# Patient Record
Sex: Female | Born: 1940 | Race: White | Hispanic: No | Marital: Married | Smoking: Never smoker
Health system: Southern US, Community
[De-identification: ages and names within clinical notes are randomized; demographics above are authoritative.]

## PROBLEM LIST (undated history)

## (undated) DIAGNOSIS — E860 Dehydration: Secondary | ICD-10-CM

## (undated) DIAGNOSIS — C50919 Malignant neoplasm of unspecified site of unspecified female breast: Secondary | ICD-10-CM

## (undated) DIAGNOSIS — C539 Malignant neoplasm of cervix uteri, unspecified: Secondary | ICD-10-CM

## (undated) DIAGNOSIS — K219 Gastro-esophageal reflux disease without esophagitis: Secondary | ICD-10-CM

## (undated) DIAGNOSIS — Z7189 Other specified counseling: Secondary | ICD-10-CM

## (undated) DIAGNOSIS — F0280 Dementia in other diseases classified elsewhere without behavioral disturbance: Secondary | ICD-10-CM

## (undated) DIAGNOSIS — F0390 Unspecified dementia without behavioral disturbance: Secondary | ICD-10-CM

## (undated) DIAGNOSIS — R87619 Unspecified abnormal cytological findings in specimens from cervix uteri: Secondary | ICD-10-CM

## (undated) DIAGNOSIS — N1330 Unspecified hydronephrosis: Secondary | ICD-10-CM

## (undated) DIAGNOSIS — G309 Alzheimer's disease, unspecified: Secondary | ICD-10-CM

## (undated) DIAGNOSIS — R1900 Intra-abdominal and pelvic swelling, mass and lump, unspecified site: Secondary | ICD-10-CM

## (undated) HISTORY — DX: Unspecified abnormal cytological findings in specimens from cervix uteri: R87.619

## (undated) HISTORY — DX: Unspecified dementia, unspecified severity, without behavioral disturbance, psychotic disturbance, mood disturbance, and anxiety: F03.90

## (undated) HISTORY — DX: Dementia in other diseases classified elsewhere, unspecified severity, without behavioral disturbance, psychotic disturbance, mood disturbance, and anxiety: F02.80

## (undated) HISTORY — DX: Other specified counseling: Z71.89

## (undated) HISTORY — DX: Malignant neoplasm of unspecified site of unspecified female breast: C50.919

## (undated) HISTORY — DX: Malignant neoplasm of cervix uteri, unspecified: C53.9

## (undated) HISTORY — DX: Dehydration: E86.0

## (undated) HISTORY — PX: TOTAL HIP ARTHROPLASTY: SHX124

## (undated) HISTORY — PX: APPENDECTOMY: SHX54

## (undated) HISTORY — DX: Gastro-esophageal reflux disease without esophagitis: K21.9

## (undated) HISTORY — PX: BREAST LUMPECTOMY: SHX2

## (undated) HISTORY — DX: Alzheimer's disease, unspecified: G30.9

---

## 2007-09-27 ENCOUNTER — Ambulatory Visit: Payer: Self-pay | Admitting: Internal Medicine

## 2008-10-26 ENCOUNTER — Ambulatory Visit: Payer: Self-pay | Admitting: Internal Medicine

## 2008-11-13 ENCOUNTER — Ambulatory Visit: Payer: Self-pay | Admitting: General Practice

## 2008-11-28 ENCOUNTER — Inpatient Hospital Stay: Payer: Self-pay | Admitting: General Practice

## 2009-10-12 ENCOUNTER — Ambulatory Visit: Payer: Self-pay | Admitting: Internal Medicine

## 2009-10-29 ENCOUNTER — Ambulatory Visit: Payer: Self-pay | Admitting: Internal Medicine

## 2011-09-04 ENCOUNTER — Ambulatory Visit: Payer: Self-pay | Admitting: Internal Medicine

## 2012-05-12 ENCOUNTER — Ambulatory Visit: Payer: Self-pay | Admitting: Internal Medicine

## 2014-12-08 ENCOUNTER — Ambulatory Visit: Payer: Self-pay | Admitting: Internal Medicine

## 2014-12-12 ENCOUNTER — Ambulatory Visit: Payer: Self-pay | Admitting: Internal Medicine

## 2016-01-14 ENCOUNTER — Other Ambulatory Visit: Payer: Self-pay | Admitting: Internal Medicine

## 2016-01-14 DIAGNOSIS — R1310 Dysphagia, unspecified: Secondary | ICD-10-CM

## 2016-01-14 DIAGNOSIS — Z1231 Encounter for screening mammogram for malignant neoplasm of breast: Secondary | ICD-10-CM

## 2016-01-16 ENCOUNTER — Ambulatory Visit: Payer: Self-pay

## 2016-01-23 ENCOUNTER — Ambulatory Visit: Payer: Self-pay

## 2016-01-28 ENCOUNTER — Ambulatory Visit: Payer: Self-pay

## 2017-08-07 ENCOUNTER — Other Ambulatory Visit: Payer: Self-pay | Admitting: Internal Medicine

## 2017-08-07 ENCOUNTER — Ambulatory Visit
Admission: RE | Admit: 2017-08-07 | Discharge: 2017-08-07 | Disposition: A | Discharge status: Home / Self Care | Payer: Medicare Other | Source: Ambulatory Visit | Attending: Internal Medicine | Admitting: Internal Medicine

## 2017-08-07 DIAGNOSIS — N1330 Unspecified hydronephrosis: Secondary | ICD-10-CM | POA: Diagnosis not present

## 2017-08-07 DIAGNOSIS — R918 Other nonspecific abnormal finding of lung field: Secondary | ICD-10-CM | POA: Diagnosis not present

## 2017-08-07 DIAGNOSIS — R590 Localized enlarged lymph nodes: Secondary | ICD-10-CM | POA: Diagnosis not present

## 2017-08-10 ENCOUNTER — Other Ambulatory Visit: Payer: Self-pay | Admitting: Radiology

## 2017-08-10 ENCOUNTER — Encounter: Payer: Self-pay | Admitting: Urology

## 2017-08-10 ENCOUNTER — Ambulatory Visit (INDEPENDENT_AMBULATORY_CARE_PROVIDER_SITE_OTHER): Payer: Medicare Other | Admitting: Urology

## 2017-08-10 VITALS — BP 140/82 | HR 83 | Ht 65.0 in | Wt 138.2 lb

## 2017-08-10 DIAGNOSIS — N1330 Unspecified hydronephrosis: Secondary | ICD-10-CM

## 2017-08-10 LAB — MICROSCOPIC EXAMINATION
BACTERIA UA: NONE SEEN
Epithelial Cells (non renal): NONE SEEN /hpf (ref 0–10)

## 2017-08-10 LAB — URINALYSIS, COMPLETE
Bilirubin, UA: NEGATIVE
GLUCOSE, UA: NEGATIVE
Ketones, UA: NEGATIVE
NITRITE UA: NEGATIVE
Protein, UA: NEGATIVE
Specific Gravity, UA: 1.01 (ref 1.005–1.030)
UUROB: 0.2 mg/dL (ref 0.2–1.0)
pH, UA: 5 (ref 5.0–7.5)

## 2017-08-10 NOTE — H&P (View-Only) (Signed)
08/10/2017 2:26 PM   Jenna Parrish 02-04-1941 [id number]  Referring provider: Adin Hector, MD [facility] [place],  [address]  Chief Complaint  Patient presents with  . Hydronephrosis    HPI: Consulted for hydronephrosis  The patient was assessed today for hydronephrosis.  Her husband answered most of the questions.  Apparently she does have some dementia.  By history she voids every 1 or 2 hours sometimes a small amount.  She might get up once at night.  On Saturday without other symptoms she noted she had blood in the urine.  She presented in the last month with dizziness diffuse back pain and not eating well.  She was sent over I believe by her primary care physician Dr. Caryl Comes  The patient's back pain is diffuse on both sides from the costal margin down to the lower back.  I reviewed her CT scan and she has impressive bilateral hydronephrosis as noted below.  Her serum creatinine May 15 was 1.0 and on November 7 it was 2.1  Today she did not have microscopic hematuria  Modifying factors: There are no other modifying factors  Associated signs and symptoms: There are no other associated signs and symptoms Aggravating and relieving factors: There are no other aggravating or relieving factors Severity: Moderate Duration: Persistent  Nov 9th CTscan Adrenals/Urinary Tract: The adrenal glands are unremarkable. Bilateral hydronephrosis and hydroureter identified right greater than left. Dilated right ureter extends into the pelvis where it abruptly terminates near the expanded vaginal dome/cervix, image number 58 of series 2. Similarly the dilated left ureter terminates in the region of the expanded left side of vaginal dome/cervix. This area is poorly visualized due to lack of IV contrast material and beam hardening artifact from right hip arthroplasty device. Urinary bladder is grossly unremarkable.  IMPRESSION: 1. Bilateral  hydronephrosis identified right greater than left. Dilated ureters extend to the level of the pelvis. No obstructing stone identified. Suspicious for distal ureteral obstruction secondary to pelvic mass. 2. Enlargement of the vaginal dome and cervical area. Suspicious for underlying malignancy. Correlation with physical exam findings, pelvic ultrasound and/or contrast enhanced CT of the pelvis is advised. 3. Enlarged retroperitoneal lymph nodes. Suspicious for metastatic adenopathy. 4. There are 2 nonspecific pulmonary nodules identified within the right lower lobe which measure up to 1 cm. Non-contrast chest CT at 3-6 months is recommended. If the nodules are stable at time of repeat CT, then future CT at 18-24 months (from today's scan) is considered optional for low-risk patients, but is recommended for high-risk patients.    PMH: Past Medical History:  Diagnosis Date  . Breast cancer ([place])   . Dementia   . GERD (gastroesophageal reflux disease)     Surgical History: Past Surgical History:  Procedure Laterality Date  . APPENDECTOMY    . BREAST LUMPECTOMY      Home Medications:  Allergies as of 08/10/2017      Reactions   Salicylates Hives   Other reaction(s): Unknown   Codeine    Other reaction(s): Unknown   Donepezil    Other reaction(s): Other (See Comments) sedation   Galantamine    Other reaction(s): Dizziness      Medication List        Accurate as of 08/10/17  2:26 PM. Always use your most recent med list.          alendronate 70 MG tablet Commonly known as:  FOSAMAX TAKE 1 TABLET BY MOUTH  EVERY 7 DAYS. TAKE WITH A FULL GLASS OF WATER. DO NOT LIE DOWN FOR THE NEXT 30 MINUTES.   D 1000 1000 units capsule Generic drug:  Cholecalciferol Take by mouth.   memantine 10 MG tablet Commonly known as:  NAMENDA Take by mouth.   vitamin B-12 1000 MCG tablet Commonly known as:  CYANOCOBALAMIN Take 1,000 mcg daily by mouth.       Allergies:    Allergies  Allergen Reactions  . Salicylates Hives    Other reaction(s): Unknown  . Codeine     Other reaction(s): Unknown  . Donepezil     Other reaction(s): Other (See Comments) sedation  . Galantamine     Other reaction(s): Dizziness    Family History: Family History  Problem Relation Age of Onset  . Bladder Cancer Neg Hx   . Kidney cancer Neg Hx     Social History:  reports that  has never smoked. she has never used smokeless tobacco. She reports that she does not drink alcohol or use drugs.  ROS: UROLOGY Frequent Urination?: Yes Hard to postpone urination?: No Burning/pain with urination?: No Get up at night to urinate?: No Leakage of urine?: No Urine stream starts and stops?: No Trouble starting stream?: No Do you have to strain to urinate?: No Blood in urine?: Yes Urinary tract infection?: No Sexually transmitted disease?: No Injury to kidneys or bladder?: No Painful intercourse?: No Weak stream?: No Currently pregnant?: No Vaginal bleeding?: No Last menstrual period?: n  Gastrointestinal Nausea?: Yes Vomiting?: No Indigestion/heartburn?: No Diarrhea?: No Constipation?: No  Constitutional Fever: No Night sweats?: No Weight loss?: No Fatigue?: No  Skin Skin rash/lesions?: No Itching?: No  Eyes Blurred vision?: No Double vision?: No  Ears/Nose/Throat Sore throat?: No Sinus problems?: No  Hematologic/Lymphatic Swollen glands?: No Easy bruising?: No  Cardiovascular Leg swelling?: No Chest pain?: No  Respiratory Cough?: No Shortness of breath?: No  Endocrine Excessive thirst?: No  Musculoskeletal Back pain?: Yes Joint pain?: No  Neurological Headaches?: Yes Dizziness?: Yes  Psychologic Depression?: No Anxiety?: No  Physical Exam: BP 140/82 (BP Location: Right Arm, Patient Position: Sitting, Cuff Size: Normal)   Pulse 83   Ht 5\' 5"  (1.651 m)   Wt 138 lb 3.2 oz (62.7 kg)   BMI 23.00 kg/m   Constitutional:  Alert  and oriented, No acute distress. HEENT: [place] AT, moist mucus membranes.  Trachea midline, no masses. Cardiovascular: No clubbing, cyanosis, or edema. Respiratory: Normal respiratory effort, no increased work of breathing. GI: Abdomen is soft, nontender, nondistended, no abdominal masses GU: On pelvic examination the bladder was well supported.  I tried to use a double speculum to visualize the cervix.  I thought it felt a bit nodular but I could not say for certain.  Because the discomfort I was not able to do a thorough examination and visualize a cervix with a double speculum. Skin: No rashes, bruises or suspicious lesions. Lymph: No cervical or inguinal adenopathy. Neurologic: Grossly intact, no focal deficits, moving all 4 extremities. Psychiatric: Normal mood and affect.  Laboratory Data:  Urinalysis No results found for: COLORURINE, APPEARANCEUR, LABSPEC, PHURINE, GLUCOSEU, HGBUR, BILIRUBINUR, KETONESUR, PROTEINUR, UROBILINOGEN, NITRITE, LEUKOCYTESUR  Pertinent Imaging: As noted  Assessment & Plan: A picture was drawn.  The patient has bilateral hydronephrosis from a pelvic mass.  She has not had a hysterectomy.  I had difficulty finding clinical notes today.  The bleeding perhaps was uterine but she will need a cystoscopy.  The role of cystoscopy and  bilateral stents with pros cons and risks discussed.  Assuming the cystoscopy to be normal the patient would benefit from a gynecological workup.  A consultation with gynecology had already been suggested.  I think the stents should be put in fairly quickly but they are not an emergency.  They will be placed by 1 of my partners.  It is my understanding that the patient will be seen her primary care physician immediately after this visit.  I will be happy to speak with him and my cell number is [phone] if needed   1. Hydronephrosis, unspecified hydronephrosis type 2.  Renal insufficiency - Urinalysis, Complete   No Follow-up on  file.  Reece Packer, MD  [facility] [address], [place],  [address] [phone]

## 2017-08-10 NOTE — Progress Notes (Signed)
08/10/2017 2:26 PM   Jenna Parrish 11-27-1940 [id number]  Referring provider: Adin Hector, MD [facility] [place], [place] [address]  Chief Complaint  Patient presents with  . Hydronephrosis    HPI: Consulted for hydronephrosis  The patient was assessed today for hydronephrosis.  Her husband answered most of the questions.  Apparently she does have some dementia.  By history she voids every 1 or 2 hours sometimes a small amount.  She might get up once at night.  On Saturday without other symptoms she noted she had blood in the urine.  She presented in the last month with dizziness diffuse back pain and not eating well.  She was sent over I believe by her primary care physician Dr. Caryl Comes  The patient's back pain is diffuse on both sides from the costal margin down to the lower back.  I reviewed her CT scan and she has impressive bilateral hydronephrosis as noted below.  Her serum creatinine May 15 was 1.0 and on November 7 it was 2.1  Today she did not have microscopic hematuria  Modifying factors: There are no other modifying factors  Associated signs and symptoms: There are no other associated signs and symptoms Aggravating and relieving factors: There are no other aggravating or relieving factors Severity: Moderate Duration: Persistent  Nov 9th CTscan Adrenals/Urinary Tract: The adrenal glands are unremarkable. Bilateral hydronephrosis and hydroureter identified right greater than left. Dilated right ureter extends into the pelvis where it abruptly terminates near the expanded vaginal dome/cervix, image number 58 of series 2. Similarly the dilated left ureter terminates in the region of the expanded left side of vaginal dome/cervix. This area is poorly visualized due to lack of IV contrast material and beam hardening artifact from right hip arthroplasty device. Urinary bladder is grossly unremarkable.  IMPRESSION: 1. Bilateral  hydronephrosis identified right greater than left. Dilated ureters extend to the level of the pelvis. No obstructing stone identified. Suspicious for distal ureteral obstruction secondary to pelvic mass. 2. Enlargement of the vaginal dome and cervical area. Suspicious for underlying malignancy. Correlation with physical exam findings, pelvic ultrasound and/or contrast enhanced CT of the pelvis is advised. 3. Enlarged retroperitoneal lymph nodes. Suspicious for metastatic adenopathy. 4. There are 2 nonspecific pulmonary nodules identified within the right lower lobe which measure up to 1 cm. Non-contrast chest CT at 3-6 months is recommended. If the nodules are stable at time of repeat CT, then future CT at 18-24 months (from today's scan) is considered optional for low-risk patients, but is recommended for high-risk patients.    PMH: Past Medical History:  Diagnosis Date  . Breast cancer ([place])   . Dementia   . GERD (gastroesophageal reflux disease)     Surgical History: Past Surgical History:  Procedure Laterality Date  . APPENDECTOMY    . BREAST LUMPECTOMY      Home Medications:  Allergies as of 08/10/2017      Reactions   Salicylates Hives   Other reaction(s): Unknown   Codeine    Other reaction(s): Unknown   Donepezil    Other reaction(s): Other (See Comments) sedation   Galantamine    Other reaction(s): Dizziness      Medication List        Accurate as of 08/10/17  2:26 PM. Always use your most recent med list.          alendronate 70 MG tablet Commonly known as:  FOSAMAX TAKE 1 TABLET BY MOUTH  EVERY 7 DAYS. TAKE WITH A FULL GLASS OF WATER. DO NOT LIE DOWN FOR THE NEXT 30 MINUTES.   D 1000 1000 units capsule Generic drug:  Cholecalciferol Take by mouth.   memantine 10 MG tablet Commonly known as:  NAMENDA Take by mouth.   vitamin B-12 1000 MCG tablet Commonly known as:  CYANOCOBALAMIN Take 1,000 mcg daily by mouth.       Allergies:    Allergies  Allergen Reactions  . Salicylates Hives    Other reaction(s): Unknown  . Codeine     Other reaction(s): Unknown  . Donepezil     Other reaction(s): Other (See Comments) sedation  . Galantamine     Other reaction(s): Dizziness    Family History: Family History  Problem Relation Age of Onset  . Bladder Cancer Neg Hx   . Kidney cancer Neg Hx     Social History:  reports that  has never smoked. she has never used smokeless tobacco. She reports that she does not drink alcohol or use drugs.  ROS: UROLOGY Frequent Urination?: Yes Hard to postpone urination?: No Burning/pain with urination?: No Get up at night to urinate?: No Leakage of urine?: No Urine stream starts and stops?: No Trouble starting stream?: No Do you have to strain to urinate?: No Blood in urine?: Yes Urinary tract infection?: No Sexually transmitted disease?: No Injury to kidneys or bladder?: No Painful intercourse?: No Weak stream?: No Currently pregnant?: No Vaginal bleeding?: No Last menstrual period?: n  Gastrointestinal Nausea?: Yes Vomiting?: No Indigestion/heartburn?: No Diarrhea?: No Constipation?: No  Constitutional Fever: No Night sweats?: No Weight loss?: No Fatigue?: No  Skin Skin rash/lesions?: No Itching?: No  Eyes Blurred vision?: No Double vision?: No  Ears/Nose/Throat Sore throat?: No Sinus problems?: No  Hematologic/Lymphatic Swollen glands?: No Easy bruising?: No  Cardiovascular Leg swelling?: No Chest pain?: No  Respiratory Cough?: No Shortness of breath?: No  Endocrine Excessive thirst?: No  Musculoskeletal Back pain?: Yes Joint pain?: No  Neurological Headaches?: Yes Dizziness?: Yes  Psychologic Depression?: No Anxiety?: No  Physical Exam: BP 140/82 (BP Location: Right Arm, Patient Position: Sitting, Cuff Size: Normal)   Pulse 83   Ht 5\' 5"  (1.651 m)   Wt 138 lb 3.2 oz (62.7 kg)   BMI 23.00 kg/m   Constitutional:  Alert  and oriented, No acute distress. HEENT: [place] AT, moist mucus membranes.  Trachea midline, no masses. Cardiovascular: No clubbing, cyanosis, or edema. Respiratory: Normal respiratory effort, no increased work of breathing. GI: Abdomen is soft, nontender, nondistended, no abdominal masses GU: On pelvic examination the bladder was well supported.  I tried to use a double speculum to visualize the cervix.  I thought it felt a bit nodular but I could not say for certain.  Because the discomfort I was not able to do a thorough examination and visualize a cervix with a double speculum. Skin: No rashes, bruises or suspicious lesions. Lymph: No cervical or inguinal adenopathy. Neurologic: Grossly intact, no focal deficits, moving all 4 extremities. Psychiatric: Normal mood and affect.  Laboratory Data:  Urinalysis No results found for: COLORURINE, APPEARANCEUR, LABSPEC, PHURINE, GLUCOSEU, HGBUR, BILIRUBINUR, KETONESUR, PROTEINUR, UROBILINOGEN, NITRITE, LEUKOCYTESUR  Pertinent Imaging: As noted  Assessment & Plan: A picture was drawn.  The patient has bilateral hydronephrosis from a pelvic mass.  She has not had a hysterectomy.  I had difficulty finding clinical notes today.  The bleeding perhaps was uterine but she will need a cystoscopy.  The role of cystoscopy and  bilateral stents with pros cons and risks discussed.  Assuming the cystoscopy to be normal the patient would benefit from a gynecological workup.  A consultation with gynecology had already been suggested.  I think the stents should be put in fairly quickly but they are not an emergency.  They will be placed by 1 of my partners.  It is my understanding that the patient will be seen her primary care physician immediately after this visit.  I will be happy to speak with him and my cell number is [phone] if needed   1. Hydronephrosis, unspecified hydronephrosis type 2.  Renal insufficiency - Urinalysis, Complete   No Follow-up on  file.  Reece Packer, MD  [facility] [address], [place], [place] [address] [phone]

## 2017-08-11 ENCOUNTER — Encounter
Admission: RE | Admit: 2017-08-11 | Discharge: 2017-08-11 | Disposition: A | Discharge status: Home / Self Care | Payer: Medicare Other | Source: Ambulatory Visit | Attending: Urology | Admitting: Urology

## 2017-08-11 ENCOUNTER — Other Ambulatory Visit: Payer: Self-pay

## 2017-08-11 DIAGNOSIS — R590 Localized enlarged lymph nodes: Secondary | ICD-10-CM | POA: Diagnosis present

## 2017-08-11 DIAGNOSIS — Z85828 Personal history of other malignant neoplasm of skin: Secondary | ICD-10-CM

## 2017-08-11 DIAGNOSIS — Q6210 Congenital occlusion of ureter, unspecified: Secondary | ICD-10-CM

## 2017-08-11 DIAGNOSIS — Z853 Personal history of malignant neoplasm of breast: Secondary | ICD-10-CM

## 2017-08-11 DIAGNOSIS — N131 Hydronephrosis with ureteral stricture, not elsewhere classified: Secondary | ICD-10-CM | POA: Diagnosis not present

## 2017-08-11 DIAGNOSIS — K219 Gastro-esophageal reflux disease without esophagitis: Secondary | ICD-10-CM | POA: Diagnosis present

## 2017-08-11 DIAGNOSIS — N179 Acute kidney failure, unspecified: Secondary | ICD-10-CM | POA: Diagnosis not present

## 2017-08-11 DIAGNOSIS — R1900 Intra-abdominal and pelvic swelling, mass and lump, unspecified site: Secondary | ICD-10-CM | POA: Diagnosis present

## 2017-08-11 DIAGNOSIS — Z79899 Other long term (current) drug therapy: Secondary | ICD-10-CM | POA: Diagnosis not present

## 2017-08-11 DIAGNOSIS — Z96641 Presence of right artificial hip joint: Secondary | ICD-10-CM | POA: Diagnosis not present

## 2017-08-11 DIAGNOSIS — Z888 Allergy status to other drugs, medicaments and biological substances status: Secondary | ICD-10-CM | POA: Diagnosis not present

## 2017-08-11 DIAGNOSIS — F0390 Unspecified dementia without behavioral disturbance: Secondary | ICD-10-CM | POA: Diagnosis present

## 2017-08-11 DIAGNOSIS — Z886 Allergy status to analgesic agent status: Secondary | ICD-10-CM

## 2017-08-11 DIAGNOSIS — M810 Age-related osteoporosis without current pathological fracture: Secondary | ICD-10-CM | POA: Diagnosis not present

## 2017-08-11 DIAGNOSIS — Z885 Allergy status to narcotic agent status: Secondary | ICD-10-CM | POA: Diagnosis not present

## 2017-08-11 DIAGNOSIS — R918 Other nonspecific abnormal finding of lung field: Secondary | ICD-10-CM | POA: Diagnosis present

## 2017-08-11 MED ORDER — CIPROFLOXACIN IN D5W 400 MG/200ML IV SOLN
400.0000 mg | INTRAVENOUS | Status: AC
  Administered 2017-08-12: 400 mg via INTRAVENOUS

## 2017-08-11 NOTE — Patient Instructions (Signed)
Your procedure is scheduled on: Tomorrow Report to DAY SURGERY. 2ND FLOOR MEDICAL MALL ENTRANCE. At 12:00 To find out your arrival time please call [phone] between 1PM - 3PM on .  Remember: Instructions that are not followed completely may result in serious medical risk, up to and including death, or upon the discretion of your surgeon and anesthesiologist your surgery may need to be rescheduled.    __X__ 1. Do not eat anything after midnight the night before your    procedure.  No gum chewing or hard candies.  You may drink clear   liquids up to 2 hours before you are scheduled to arrive at the   hospital for your procedure. Do not drink clear liquids within 2   hours of scheduled arrival to the hospital as this may lead to your   procedure being delayed or rescheduled.       Clear liquids include:   Water or Apple juice without pulp   Clear carbohydrate beverage such as Clearfast or Gatorade   Black coffee or Clear Tea (no milk, no creamer, do not add anything   to the coffee or tea)    Diabetics should only drink water   __X__ 2. No Alcohol for 24 hours before or after surgery.   ____ 3. Bring all medications with you on the day of surgery if instructed.    __X__ 4. Notify your doctor if there is any change in your medical condition     (cold, fever, infections).             __X___5. No smoking within 24 hours of your surgery.     Do not wear jewelry, make-up, hairpins, clips or nail polish.  Do not wear lotions, powders, or perfumes.   Do not shave 48 hours prior to surgery. Men may shave face and neck.  Do not bring valuables to the hospital.    [facility] is not responsible for any belongings or valuables.               Contacts, dentures or bridgework may not be worn into surgery.  Leave your suitcase in the car. After surgery it may be brought to your room.  For patients admitted to the hospital, discharge time is determined by your                treatment  team.   Patients discharged the day of surgery will not be allowed to drive home.   Please read over the following fact sheets that you were given:   MRSA Information   __X__ Take these medicines the morning of surgery with A SIP OF WATER:    1. NAMENDA  2.   3.   4.  5.  6.  ____ Fleet Enema (as directed)   __X _ Use CHG Soap/SAGE wipes as directed  ____ Use inhalers on the day of surgery  ____ Stop metformin 2 days prior to surgery    ____ Take 1/2 of usual insulin dose the night before surgery and none on the morning of surgery.   ____ Stop Coumadin/Plavix/aspirin on   __X__ Stop Anti-inflammatories such as Advil, Aleve, Ibuprofen, Motrin, Naproxen, Naprosyn, Goodies,powder, or aspirin products.  OK to take Tylenol.   __X__ Stop supplements, Vitamin E, Fish Oil until after surgery.    ____ Bring C-Pap to the hospital.

## 2017-08-11 NOTE — H&P (Signed)
Jenna Parrish is a 76 y.o. female here for Referred by Dr Caryl Comes for pelvic mass .  Consult from Dr Caryl Comes for rising creatinine and bilateral hydronephrosis/ hydroureter right> left.  Ctscan 08/07/17 shows  Dilated ureters terminates at " expanded vaginal dome " . + retroperitoneal adenopathy( aortacaval node 2.1 cm ) and periaortic 1.0 cm . 2 nodules noted in RLL .  U/s difficult to perform TVUS due to patient discomfort . Adb u/s shows the endometrial stripe  10.6 mm . Complex 3cm left ovarian cyst internal projection .   Pt has been dizzy according to husband for 30 days and has lost 5 pounds in 6 months. Appetite poor with early satiety . + vaginal bleeding last week.   patient has not been getting pelvic exams. She to her husband's account has not had a colonoscopy .   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    Past Medical History:  has a past medical history of Allergic state, Cancer (CMS-HCC), Diverticulosis, GERD (gastroesophageal reflux disease), History of shingles, Hyperlipidemia, unspecified, Leukopenia, Osteoarthritis, Osteoporosis, post-menopausal, and Peripheral neuropathy.  Past Surgical History:  has a past surgical history that includes Tubal ligation (Bilateral); Skin cancer removal (Left, 2007); Bladder tack (03/2001); Mastectomy partial / lumpectomy w/ axillary lymphadenectomy (03/2000); Arthroplasty Hip Total (Right, 11/28/08); and Appendectomy (1963). Family History: family history includes Coronary Artery Disease (Blocked arteries around heart) in her father; High blood pressure (Hypertension) in her father; Hyperlipidemia (Elevated cholesterol) in her father; Myocardial Infarction (Heart attack) in her father. Social History:  reports that she has never smoked. She has never used smokeless tobacco. She reports that she does not drink alcohol or use drugs. OB/GYN History:          OB History    Gravida  2   Para  2   Term      Preterm      AB      Living  2     SAB      TAB      Ectopic      Molar      Multiple      Live Births  2          Allergies: is allergic to aspirin; aricept [donepezil]; codeine phosphate (bulk); galantamine; and salicylates. Medications:  Current Outpatient Medications:  .  alendronate (FOSAMAX) 70 MG tablet, TAKE 1 TABLET BY MOUTH EVERY 7 DAYS. TAKE WITH A FULL GLASS OF WATER. DO NOT LIE DOWN FOR THE NEXT 30 MINUTES., Disp: 12 tablet, Rfl: 4 .  cholecalciferol (VITAMIN D3) 1,000 unit capsule, Take 1,000 Units by mouth once daily., Disp: , Rfl:  .  CYANOCOBALAMIN, VITAMIN B-12, (VITAMIN B12 ORAL), Take by mouth. One daily, Disp: , Rfl:  .  memantine (NAMENDA) 10 MG tablet, Take 1 tablet (10 mg total) by mouth 2 (two) times daily., Disp: 60 tablet, Rfl: 11  Review of Systems: General:                      No fatigue, +weight loss, + dizziness Eyes:                           No vision changes Ears:                            No hearing difficulty Respiratory:  No cough or shortness of breath Pulmonary:                  No asthma or shortness of breath Cardiovascular:           No chest pain, palpitations, dyspnea on exertion Gastrointestinal:          No abdominal bloating, chronic diarrhea, constipations, masses, pain or hematochezia, + nausea Genitourinary:             No hematuria, dysuria, abnormal vaginal discharge, pelvic pain, Menometrorrhagia, + PMB  Lymphatic:                   No swollen lymph nodes Musculoskeletal:         No muscle weakness Neurologic:                  No extremity weakness, syncope, seizure disorder Psychiatric:                  No history of depression, delusions or suicidal/homicidal ideation, + memory loss    Exam:      Vitals:   08/11/17 1029  BP: 132/89  Pulse: 79    Body mass index is 23.13 kg/m.  WDWN white/ female in NAD   Lungs: CTA  CV : RRR without murmur   Breast: exam done in sitting and lying  position : No dimpling or retraction, no dominant mass, no spontaneous discharge, no axillary adenopathy Neck:  no thyromegaly Abdomen: soft , no mass, normal active bowel sounds,  non-tender, no rebound tenderness Pelvic: tanner stage 5 ,  External genitalia: vulva /labia no lesions Urethra: no prolapse Vagina: vault is full and firm  Cervix: 5x5 cm mass at vault, cervix is ill defined . Friable with pap . Mass is fixed  Uterus: 8cm, , fixed  Adnexa: no mass,  non-tender   Rectovaginal: no mass heme pending   Impression:   The primary encounter diagnosis was Cervical mass. Diagnoses of Retroperitoneal lymphadenopathy, Hydroureter, Mass of left ovary, Thickened endometrium, Cervical cancer screening, and Encounter for screening fecal occult blood testing were also pertinent to this visit.  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  Plan:   Pt to get ureteral stenting tomorrow by urology in OR  I will perform cx bx and attempt to sample the lining of the endometrium  She is set up for consultation with Gyn Onc 08/19/17 at Cancer center      .  Caroline Sauger, MD

## 2017-08-12 ENCOUNTER — Encounter: Payer: Self-pay | Admitting: *Deleted

## 2017-08-12 ENCOUNTER — Ambulatory Visit: Payer: Medicare Other

## 2017-08-12 ENCOUNTER — Encounter
Admission: RE | Disposition: A | Discharge status: Home / Self Care | Payer: Self-pay | Source: Ambulatory Visit | Attending: Urology

## 2017-08-12 ENCOUNTER — Ambulatory Visit: Payer: Medicare Other | Admitting: Certified Registered Nurse Anesthetist

## 2017-08-12 ENCOUNTER — Telehealth: Payer: Self-pay | Admitting: Urology

## 2017-08-12 ENCOUNTER — Ambulatory Visit
Admission: RE | Admit: 2017-08-12 | Discharge: 2017-08-12 | Disposition: A | Discharge status: Home / Self Care | Payer: Medicare Other | Source: Ambulatory Visit | Attending: Urology | Admitting: Urology

## 2017-08-12 DIAGNOSIS — Z853 Personal history of malignant neoplasm of breast: Secondary | ICD-10-CM | POA: Insufficient documentation

## 2017-08-12 DIAGNOSIS — F0390 Unspecified dementia without behavioral disturbance: Secondary | ICD-10-CM

## 2017-08-12 DIAGNOSIS — M810 Age-related osteoporosis without current pathological fracture: Secondary | ICD-10-CM | POA: Insufficient documentation

## 2017-08-12 DIAGNOSIS — Z79899 Other long term (current) drug therapy: Secondary | ICD-10-CM

## 2017-08-12 DIAGNOSIS — Q6210 Congenital occlusion of ureter, unspecified: Secondary | ICD-10-CM

## 2017-08-12 DIAGNOSIS — R918 Other nonspecific abnormal finding of lung field: Secondary | ICD-10-CM

## 2017-08-12 DIAGNOSIS — N1330 Unspecified hydronephrosis: Secondary | ICD-10-CM | POA: Diagnosis not present

## 2017-08-12 DIAGNOSIS — Z96641 Presence of right artificial hip joint: Secondary | ICD-10-CM

## 2017-08-12 DIAGNOSIS — K219 Gastro-esophageal reflux disease without esophagitis: Secondary | ICD-10-CM

## 2017-08-12 DIAGNOSIS — Z85828 Personal history of other malignant neoplasm of skin: Secondary | ICD-10-CM | POA: Insufficient documentation

## 2017-08-12 DIAGNOSIS — N179 Acute kidney failure, unspecified: Secondary | ICD-10-CM | POA: Diagnosis not present

## 2017-08-12 DIAGNOSIS — R590 Localized enlarged lymph nodes: Secondary | ICD-10-CM | POA: Insufficient documentation

## 2017-08-12 DIAGNOSIS — Z888 Allergy status to other drugs, medicaments and biological substances status: Secondary | ICD-10-CM | POA: Insufficient documentation

## 2017-08-12 DIAGNOSIS — N131 Hydronephrosis with ureteral stricture, not elsewhere classified: Secondary | ICD-10-CM

## 2017-08-12 DIAGNOSIS — Z886 Allergy status to analgesic agent status: Secondary | ICD-10-CM | POA: Insufficient documentation

## 2017-08-12 DIAGNOSIS — Z885 Allergy status to narcotic agent status: Secondary | ICD-10-CM

## 2017-08-12 DIAGNOSIS — Z419 Encounter for procedure for purposes other than remedying health state, unspecified: Secondary | ICD-10-CM

## 2017-08-12 HISTORY — PX: CERVICAL ABLATION: SHX5771

## 2017-08-12 HISTORY — PX: CYSTOSCOPY WITH STENT PLACEMENT: SHX5790

## 2017-08-12 HISTORY — PX: CYSTOSCOPY W/ RETROGRADES: SHX1426

## 2017-08-12 LAB — CBC
HCT: 37.8 % (ref 35.0–47.0)
Hemoglobin: 12.6 g/dL (ref 12.0–16.0)
MCH: 30.9 pg (ref 26.0–34.0)
MCHC: 33.3 g/dL (ref 32.0–36.0)
MCV: 92.9 fL (ref 80.0–100.0)
PLATELETS: 269 10*3/uL (ref 150–440)
RBC: 4.07 MIL/uL (ref 3.80–5.20)
RDW: 14.3 % (ref 11.5–14.5)
WBC: 5.5 10*3/uL (ref 3.6–11.0)

## 2017-08-12 LAB — TYPE AND SCREEN
ABO/RH(D): A POS
ANTIBODY SCREEN: NEGATIVE

## 2017-08-12 LAB — BASIC METABOLIC PANEL
ANION GAP: 14 (ref 5–15)
BUN: 38 mg/dL — ABNORMAL HIGH (ref 6–20)
CO2: 20 mmol/L — ABNORMAL LOW (ref 22–32)
Calcium: 9 mg/dL (ref 8.9–10.3)
Chloride: 107 mmol/L (ref 101–111)
Creatinine, Ser: 4.05 mg/dL — ABNORMAL HIGH (ref 0.44–1.00)
GFR, EST AFRICAN AMERICAN: 11 mL/min — AB (ref >60)
GFR, EST NON AFRICAN AMERICAN: 10 mL/min — AB (ref >60)
GLUCOSE: 102 mg/dL — AB (ref 65–99)
POTASSIUM: 4.5 mmol/L (ref 3.5–5.1)
SODIUM: 141 mmol/L (ref 135–145)

## 2017-08-12 LAB — ABO/RH: ABO/RH(D): A POS

## 2017-08-12 LAB — CULTURE, URINE COMPREHENSIVE

## 2017-08-12 SURGERY — CYSTOSCOPY, WITH STENT INSERTION
Anesthesia: General

## 2017-08-12 MED ORDER — FENTANYL CITRATE (PF) 100 MCG/2ML IJ SOLN: 25.0000 ug | INTRAMUSCULAR | Status: DC | PRN

## 2017-08-12 MED ORDER — DEXAMETHASONE SODIUM PHOSPHATE 10 MG/ML IJ SOLN
INTRAMUSCULAR | Status: AC
  Filled 2017-08-12: qty 1

## 2017-08-12 MED ORDER — IOTHALAMATE MEGLUMINE 43 % IV SOLN
INTRAVENOUS | Status: DC | PRN
  Administered 2017-08-12: 45 mL

## 2017-08-12 MED ORDER — LIDOCAINE HCL (CARDIAC) 20 MG/ML IV SOLN
INTRAVENOUS | Status: DC | PRN
  Administered 2017-08-12: 80 mg via INTRAVENOUS

## 2017-08-12 MED ORDER — ONDANSETRON HCL 4 MG/2ML IJ SOLN
INTRAMUSCULAR | Status: DC | PRN
  Administered 2017-08-12: 4 mg via INTRAVENOUS

## 2017-08-12 MED ORDER — LACTATED RINGERS IV SOLN: INTRAVENOUS | Status: DC

## 2017-08-12 MED ORDER — PROPOFOL 10 MG/ML IV BOLUS
INTRAVENOUS | Status: DC | PRN
  Administered 2017-08-12: 100 mg via INTRAVENOUS

## 2017-08-12 MED ORDER — FENTANYL CITRATE (PF) 100 MCG/2ML IJ SOLN
INTRAMUSCULAR | Status: DC | PRN
  Administered 2017-08-12 (×2): 50 ug via INTRAVENOUS

## 2017-08-12 MED ORDER — GLYCOPYRROLATE 0.2 MG/ML IJ SOLN
INTRAMUSCULAR | Status: DC | PRN
  Administered 2017-08-12: 0.2 mg via INTRAVENOUS

## 2017-08-12 MED ORDER — PHENYLEPHRINE HCL 10 MG/ML IJ SOLN
INTRAMUSCULAR | Status: DC | PRN
  Administered 2017-08-12: 200 ug via INTRAVENOUS
  Administered 2017-08-12 (×4): 100 ug via INTRAVENOUS

## 2017-08-12 MED ORDER — HYDROCODONE-ACETAMINOPHEN 5-325 MG PO TABS: 1.0000 | ORAL_TABLET | Freq: Four times a day (QID) | ORAL | 0 refills | Status: DC | PRN

## 2017-08-12 MED ORDER — CIPROFLOXACIN IN D5W 400 MG/200ML IV SOLN
INTRAVENOUS | Status: AC
  Administered 2017-08-12: 400 mg via INTRAVENOUS
  Filled 2017-08-12: qty 200

## 2017-08-12 MED ORDER — FAMOTIDINE 20 MG PO TABS
ORAL_TABLET | ORAL | Status: AC
  Administered 2017-08-12: 20 mg via ORAL
  Filled 2017-08-12: qty 1

## 2017-08-12 MED ORDER — LACTATED RINGERS IV SOLN
INTRAVENOUS | Status: DC
  Administered 2017-08-12: 13:00:00 via INTRAVENOUS

## 2017-08-12 MED ORDER — ONDANSETRON HCL 4 MG/2ML IJ SOLN
INTRAMUSCULAR | Status: AC
  Filled 2017-08-12: qty 2

## 2017-08-12 MED ORDER — FERRIC SUBSULFATE 259 MG/GM EX SOLN
CUTANEOUS | Status: DC | PRN
  Administered 2017-08-12: 1

## 2017-08-12 MED ORDER — FAMOTIDINE 20 MG PO TABS
20.0000 mg | ORAL_TABLET | Freq: Once | ORAL | Status: AC
  Administered 2017-08-12: 20 mg via ORAL

## 2017-08-12 MED ORDER — SODIUM CHLORIDE FLUSH 0.9 % IV SOLN
INTRAVENOUS | Status: AC
  Filled 2017-08-12: qty 10

## 2017-08-12 MED ORDER — DEXAMETHASONE SODIUM PHOSPHATE 10 MG/ML IJ SOLN
INTRAMUSCULAR | Status: DC | PRN
  Administered 2017-08-12: 10 mg via INTRAVENOUS

## 2017-08-12 MED ORDER — FENTANYL CITRATE (PF) 100 MCG/2ML IJ SOLN
INTRAMUSCULAR | Status: AC
  Filled 2017-08-12: qty 2

## 2017-08-12 MED ORDER — FERRIC SUBSULFATE 259 MG/GM EX SOLN
CUTANEOUS | Status: AC
  Filled 2017-08-12: qty 8

## 2017-08-12 MED ORDER — DOCUSATE SODIUM 100 MG PO CAPS: 100.0000 mg | ORAL_CAPSULE | Freq: Two times a day (BID) | ORAL | 0 refills | Status: DC

## 2017-08-12 MED ORDER — ONDANSETRON HCL 4 MG/2ML IJ SOLN: 4.0000 mg | Freq: Once | INTRAMUSCULAR | Status: DC | PRN

## 2017-08-12 SURGICAL SUPPLY — 45 items
APPLICATOR COTTON TIP 6IN STRL (MISCELLANEOUS) ×16 IMPLANT
BAG DRAIN CYSTO-URO LG1000N (MISCELLANEOUS) ×4 IMPLANT
CANISTER SUCT 3000ML PPV (MISCELLANEOUS) ×4 IMPLANT
CATH ROBINSON RED A/P 16FR (CATHETERS) ×4 IMPLANT
CATH URETL 5X70 OPEN END (CATHETERS) ×4 IMPLANT
CONRAY 43 FOR UROLOGY 50M (MISCELLANEOUS) ×4 IMPLANT
COVER LIGHT HANDLE STERIS (MISCELLANEOUS) ×4 IMPLANT
DRAPE UTILITY 15X26 TOWEL STRL (DRAPES) ×4 IMPLANT
DRSG TELFA 4X3 1S NADH ST (GAUZE/BANDAGES/DRESSINGS) ×4 IMPLANT
ELECT REM PT RETURN 9FT ADLT (ELECTROSURGICAL) ×4
ELECTRODE REM PT RTRN 9FT ADLT (ELECTROSURGICAL) ×2 IMPLANT
GLIDEWIRE STIFF .35X180X3 HYDR (WIRE) ×4 IMPLANT
GLOVE BIO SURGEON STRL SZ 6.5 (GLOVE) ×3 IMPLANT
GLOVE BIO SURGEON STRL SZ8 (GLOVE) ×4 IMPLANT
GLOVE BIO SURGEONS STRL SZ 6.5 (GLOVE) ×1
GOWN STRL REUS W/ TWL LRG LVL3 (GOWN DISPOSABLE) ×6 IMPLANT
GOWN STRL REUS W/ TWL XL LVL3 (GOWN DISPOSABLE) ×2 IMPLANT
GOWN STRL REUS W/TWL LRG LVL3 (GOWN DISPOSABLE) ×6
GOWN STRL REUS W/TWL XL LVL3 (GOWN DISPOSABLE) ×2
IV LACTATED RINGERS 1000ML (IV SOLUTION) ×4 IMPLANT
KIT RM TURNOVER CYSTO AR (KITS) ×8 IMPLANT
NDL SAFETY ECLIPSE 18X1.5 (NEEDLE) ×2 IMPLANT
NEEDLE HYPO 18GX1.5 SHARP (NEEDLE) ×2
PACK CYSTO AR (MISCELLANEOUS) ×4 IMPLANT
PACK DNC HYST (MISCELLANEOUS) ×4 IMPLANT
PAD OB MATERNITY 4.3X12.25 (PERSONAL CARE ITEMS) ×4 IMPLANT
PAD PREP 24X41 OB/GYN DISP (PERSONAL CARE ITEMS) ×4 IMPLANT
SCRUB POVIDONE IODINE 4 OZ (MISCELLANEOUS) ×4 IMPLANT
SENSORWIRE 0.038 NOT ANGLED (WIRE) ×4
SET CYSTO W/LG BORE CLAMP LF (SET/KITS/TRAYS/PACK) ×4 IMPLANT
SOL .9 NS 3000ML IRR  AL (IV SOLUTION) ×2
SOL .9 NS 3000ML IRR UROMATIC (IV SOLUTION) ×2 IMPLANT
SPONGE XRAY 4X4 16PLY STRL (MISCELLANEOUS) ×4 IMPLANT
STENT PERCUFLEX 4.8FRX24 (STENTS) ×4 IMPLANT
STENT URET 6FRX24 CONTOUR (STENTS) IMPLANT
STENT URET 6FRX26 CONTOUR (STENTS) IMPLANT
STENT URO INLAY 6FRX24CM (STENTS) ×4 IMPLANT
SURGILUBE 2OZ TUBE FLIPTOP (MISCELLANEOUS) ×4 IMPLANT
SYRINGE IRR TOOMEY STRL 70CC (SYRINGE) ×4 IMPLANT
TOWEL OR 17X26 4PK STRL BLUE (TOWEL DISPOSABLE) ×4 IMPLANT
TUBING CONNECTING 10 (TUBING) ×3 IMPLANT
TUBING CONNECTING 10' (TUBING) ×1
WATER STERILE IRR 1000ML POUR (IV SOLUTION) ×4 IMPLANT
WATER STERILE IRR 3000ML UROMA (IV SOLUTION) ×4 IMPLANT
WIRE SENSOR 0.038 NOT ANGLED (WIRE) ×2 IMPLANT

## 2017-08-12 NOTE — Interval H&P Note (Signed)
History and Physical Interval Note:  08/12/2017 11:59 AM  Jenna Parrish  has presented today for surgery, with the diagnosis of bilateral hydronephrosis  The various methods of treatment have been discussed with the patient and family. After consideration of risks, benefits and other options for treatment, the patient has consented to  Procedure(s): CYSTOSCOPY WITH STENT PLACEMENT (Bilateral) CYSTOSCOPY WITH BLADDER BIOPSY (N/A) CYSTOSCOPY WITH RETROGRADE PYELOGRAM (Bilateral) ENDOMETRIAL BIOPSY, CERVICAL BIOPSY (N/A) as a surgical intervention .  The patient's history has been reviewed, patient examined, no change in status, stable for surgery.  I have reviewed the patient's chart and labs.  Questions were answered to the patient's satisfaction.    RRR CTAB  Discussed case with Dr. Matilde Sprang and Dr. Ouida Sills.  Plan for bx by GYN at time of stents.    [name]

## 2017-08-12 NOTE — Op Note (Signed)
Date of procedure: 08/12/17  Preoperative diagnosis:  1. Bilateral hydronephrosis 2. Pelvic mass 3. Acute renal failure  Postoperative diagnosis:  1. Same as above  Procedure: 1. Cystoscopy 2. Bilateral retrograde pyelogram 3. Bilateral ureteral stent placement  Surgeon: [name], MD  Anesthesia: General  Complications: None  Intraoperative findings: Severe hydronephrosis, right greater than left.  Left severe ureteral stenosis greater than 1 cm in length involving the distal ureter with an apple core-like appearance consistent with extrinsic compression with a severely dilated ureter proximal to this.  On the right, there are several concentric areas of stenosis in the mid and distal ureter with a highly tortuous proximal ureter and renal pelvis.  Wire placement on the right was difficult and ultimately a smaller caliber stent was placed given inability to pass a larger 6 [place] stent.  EBL: minimal  Specimens: none  Drains: 6 x 24 French Bard Optima ureteral stent on left, 4.8 x 24 French ureteral stent on right  Indication: Jenna Parrish is a 76 y.o. patient with pelvic mass with extrinsic compression of the bilateral ureters causing bilateral ureteral obstruction and worsening renal failure.  After reviewing the management options for treatment, she elected to proceed with the above surgical procedure(s). We have discussed the potential benefits and risks of the procedure, side effects of the proposed treatment, the likelihood of the patient achieving the goals of the procedure, and any potential problems that might occur during the procedure or recuperation. Informed consent has been obtained.  Description of procedure:  The patient was taken to the operating room and general anesthesia was induced.  The patient was placed in the dorsal lithotomy position, prepped and draped in the usual sterile fashion, and preoperative antibiotics were administered. A preoperative  time-out was performed.   A 21 French cystoscope was advanced per urethra into the bladder.  Of note, upon vaginal prep, bleeding from the vaginal vault was appreciated.  The bladder was surveilled which had a mildly erythematous appearance but no papillary lesions.  This is nonspecific and appeared primarily inflammatory in nature.  Attention was first turned to the left ureteral orifice which is in normal anatomic position.  The ureter was then cannulated using a 5 [place] open-ended ureteral catheter and gentle retrograde pyelogram was performed.  On this side, there was a severe area of stenosis, greater than 1 cm in the distal ureter which had an apple core-like appearance most consistent with extrinsic compression.  Proximal to this, there was severe hydronephrosis and dilated ureter down to this level.  Ultimately, I was able to pass a sensor wire through the stenotic area up to level the renal pelvis fairly easily.  A 6 x 24 French Bard Optima stent was unable to be passed up to level the renal pelvis.  The wire is partially drawn until focal was noted within the renal pelvis.  The wires and fully withdrawn a full coils noted within the bladder.  Attention was then turned to the right ureteral orifice.  Retrograde on this side showed a high-grade more severe obstruction.  There were 2 areas of high-grade obstruction within the ureter, one in the mid to distal ureter and a second area in the lower pelvis.  There was dilation which was quite significant of the ureter proximal to each of these areas.  The proximal ureter was highly tortuous with a severely hydronephrotic renal pelvis and caliectasis.  Advancing a wire up to the renal pelvis was quite difficult.  Ultimately, I was  successful in advancing an angled Glidewire up to the renal pelvis which somewhat straightened out the ureter.  I was not successful in advancing a 5 [place] open-ended ureteral catheter beyond the more proximal of the 2 strictures.   I did try advancing a 6 French ureteral stent over the wire but there was buckling and was unsuccessful.  I was unable to exchange the wire for any other wire due to inability to pass the stent through the stenotic areas.  Ultimately, I was able to pass a 4.8 [place] by 24 double-J stent over the wire up to level the renal pelvis.  This is very tight secondary to high-grade obstruction.  The wire was partially drawn a full coils noted within the renal pelvis.  The wires and fully withdrawn a full coils noted within the bladder.  The bladder was then drained and the scope was removed.  At this point in time, the procedure was turned over to OB/GYN for biopsies.  Please see Dr. Janice Norrie note for further details.  Plan: Lengthy discussion with the patient's husband and a good family friend in the recovery area.  Given that her creatinine is risen now to 4, would like her to follow-up with labs tomorrow.  Additionally, the like her to have repeat labs again on Monday with a renal ultrasound to ensure that her hydronephrosis is improving.  Given the high-grade obstruction, there is a chance that her stents could fail.  All questions answered.  [name], M.D.

## 2017-08-12 NOTE — Telephone Encounter (Signed)
-----   Message from [name], MD sent at 08/12/2017  2:35 PM EST ----- Regarding: f/u next week I would like this patient to have a renal ultrasound/BMP on and see me on Tuesday or Wednesday.  Looking at my schedule next week, it looks pretty full.  It may be best to have him get the ultrasound on Monday and labs and see me on Tuesday to be double booked with the bladder fulguration that day since it is in a 45-minute slot.  [name], MD

## 2017-08-12 NOTE — Anesthesia Preprocedure Evaluation (Signed)
Anesthesia Evaluation  Patient identified by MRN, date of birth, ID band Patient awake    Reviewed: Allergy & Precautions, NPO status , Patient's Chart, lab work & pertinent test results, reviewed documented beta blocker date and time   Airway Mallampati: II  TM Distance: >3 FB     Dental  (+) Chipped   Pulmonary           Cardiovascular      Neuro/Psych    GI/Hepatic   Endo/Other    Renal/GU      Musculoskeletal   Abdominal   Peds  Hematology   Anesthesia Other Findings Dementia.  Reproductive/Obstetrics                             Anesthesia Physical Anesthesia Plan  ASA: III  Anesthesia Plan: General   Post-op Pain Management:    Induction: Intravenous  PONV Risk Score and Plan:   Airway Management Planned: LMA  Additional Equipment:   Intra-op Plan:   Post-operative Plan:   Informed Consent: I have reviewed the patients History and Physical, chart, labs and discussed the procedure including the risks, benefits and alternatives for the proposed anesthesia with the patient or authorized representative who has indicated his/her understanding and acceptance.     Plan Discussed with: CRNA  Anesthesia Plan Comments:         Anesthesia Quick Evaluation

## 2017-08-12 NOTE — Progress Notes (Signed)
Cervical and endometrial biopsies after ureteral stenting   Questions answered

## 2017-08-12 NOTE — Anesthesia Postprocedure Evaluation (Signed)
Anesthesia Post Note  Patient: Jenna Parrish  Procedure(s) Performed: CYSTOSCOPY WITH STENT PLACEMENT (Bilateral ) CYSTOSCOPY WITH RETROGRADE PYELOGRAM (Bilateral ) CERVICAL BIOPSY (N/A )  Patient location during evaluation: PACU Anesthesia Type: General Level of consciousness: awake and alert Pain management: pain level controlled Vital Signs Assessment: post-procedure vital signs reviewed and stable Respiratory status: spontaneous breathing, nonlabored ventilation, respiratory function stable and patient connected to nasal cannula oxygen Cardiovascular status: blood pressure returned to baseline and stable Postop Assessment: no apparent nausea or vomiting Anesthetic complications: no     Last Vitals:  Vitals:   08/12/17 1440 08/12/17 1456  BP: (!) 158/79 (!) 173/92  Pulse: 81 90  Resp: 12 (!) 25  Temp:    SpO2: 100% 100%    Last Pain:  Vitals:   08/12/17 1456  TempSrc:   PainSc: 0-No pain                 Paxon Propes S

## 2017-08-12 NOTE — Telephone Encounter (Signed)
done

## 2017-08-12 NOTE — Discharge Instructions (Signed)
° °  AMBULATORY SURGERY  DISCHARGE INSTRUCTIONS   1) The drugs that you were given will stay in your system until tomorrow so for the next 24 hours you should not:  A) Drive an automobile B) Make any legal decisions C) Drink any alcoholic beverage   2) You may resume regular meals tomorrow.  Today it is better to start with liquids and gradually work up to solid foods.  You may eat anything you prefer, but it is better to start with liquids, then soup and crackers, and gradually work up to solid foods.   3) Please notify your doctor immediately if you have any unusual bleeding, trouble breathing, redness and pain at the surgery site, drainage, fever, or pain not relieved by medication.    4) Additional Instructions:        Please contact your physician with any problems or Same Day Surgery at [phone], Monday through Friday 6 am to 4 pm, or  at [place] [facility] number at [phone].You have a ureteral stent in place.  This is a tube that extends from your kidney to your bladder.  This may cause urinary bleeding, burning with urination, and urinary frequency.  Please call our office or present to the ED if you develop fevers >101 or pain which is not able to be controlled with oral pain medications.  You may be given either Flomax and/ or ditropan to help with bladder spasms and stent pain in addition to pain medications.    [facility] [address], [address] [place], [place] [address] [phone]  

## 2017-08-12 NOTE — Transfer of Care (Signed)
Immediate Anesthesia Transfer of Care Note  Patient: Jenna Parrish  Procedure(s) Performed: CYSTOSCOPY WITH STENT PLACEMENT (Bilateral ) CYSTOSCOPY WITH BLADDER BIOPSY (N/A ) CYSTOSCOPY WITH RETROGRADE PYELOGRAM (Bilateral ) CERVICAL BIOPSY (N/A )  Patient Location: PACU  Anesthesia Type:General  Level of Consciousness: drowsy and patient cooperative  Airway & Oxygen Therapy: Patient Spontanous Breathing and Patient connected to face mask oxygen  Post-op Assessment: Report given to RN and Post -op Vital signs reviewed and stable  Post vital signs: Reviewed and stable  Last Vitals:  Vitals:   08/12/17 1201 08/12/17 1426  BP: (!) 157/83 (!) (P) 153/88  Pulse: 95   Resp: 20   Temp: 37.4 C (P) 37.1 C  SpO2: 100%     Last Pain:  Vitals:   08/12/17 1201  TempSrc: Temporal      Patients Stated Pain Goal: 0 (40/76/80 8811)  Complications: No apparent anesthesia complications

## 2017-08-12 NOTE — Anesthesia Post-op Follow-up Note (Signed)
Anesthesia QCDR form completed.        

## 2017-08-12 NOTE — Anesthesia Procedure Notes (Signed)
Procedure Name: LMA Insertion Date/Time: 08/12/2017 1:01 PM Performed by: Silvana Newness, CRNA Pre-anesthesia Checklist: Patient identified, Emergency Drugs available, Suction available, Patient being monitored and Timeout performed Patient Re-evaluated:Patient Re-evaluated prior to induction Oxygen Delivery Method: Circle system utilized Preoxygenation: Pre-oxygenation with 100% oxygen Induction Type: IV induction Ventilation: Mask ventilation without difficulty LMA: LMA inserted LMA Size: 4.0 Number of attempts: 1 Placement Confirmation: positive ETCO2 and breath sounds checked- equal and bilateral Tube secured with: Tape Dental Injury: Teeth and Oropharynx as per pre-operative assessment

## 2017-08-13 ENCOUNTER — Telehealth: Payer: Self-pay | Admitting: Urology

## 2017-08-13 ENCOUNTER — Other Ambulatory Visit: Payer: Self-pay | Admitting: Nurse Practitioner

## 2017-08-13 ENCOUNTER — Inpatient Hospital Stay
Admission: AD | Admit: 2017-08-13 | Discharge: 2017-08-14 | DRG: 660 | Disposition: A | Discharge status: Home / Self Care | Payer: Medicare Other | Source: Ambulatory Visit | Attending: Internal Medicine | Admitting: Internal Medicine

## 2017-08-13 ENCOUNTER — Telehealth: Payer: Self-pay | Admitting: Nurse Practitioner

## 2017-08-13 ENCOUNTER — Encounter: Payer: Self-pay | Admitting: Urology

## 2017-08-13 ENCOUNTER — Other Ambulatory Visit
Admission: RE | Admit: 2017-08-13 | Discharge: 2017-08-13 | Disposition: A | Discharge status: Home / Self Care | Payer: Medicare Other | Source: Ambulatory Visit | Attending: Urology | Admitting: Urology

## 2017-08-13 ENCOUNTER — Other Ambulatory Visit: Payer: Self-pay

## 2017-08-13 DIAGNOSIS — Q6210 Congenital occlusion of ureter, unspecified: Secondary | ICD-10-CM | POA: Diagnosis not present

## 2017-08-13 DIAGNOSIS — R1900 Intra-abdominal and pelvic swelling, mass and lump, unspecified site: Secondary | ICD-10-CM | POA: Diagnosis not present

## 2017-08-13 DIAGNOSIS — N888 Other specified noninflammatory disorders of cervix uteri: Secondary | ICD-10-CM

## 2017-08-13 DIAGNOSIS — Z886 Allergy status to analgesic agent status: Secondary | ICD-10-CM | POA: Diagnosis not present

## 2017-08-13 DIAGNOSIS — Z79899 Other long term (current) drug therapy: Secondary | ICD-10-CM | POA: Diagnosis not present

## 2017-08-13 DIAGNOSIS — Z853 Personal history of malignant neoplasm of breast: Secondary | ICD-10-CM | POA: Diagnosis not present

## 2017-08-13 DIAGNOSIS — N179 Acute kidney failure, unspecified: Secondary | ICD-10-CM | POA: Diagnosis present

## 2017-08-13 DIAGNOSIS — N1330 Unspecified hydronephrosis: Secondary | ICD-10-CM

## 2017-08-13 DIAGNOSIS — Z888 Allergy status to other drugs, medicaments and biological substances status: Secondary | ICD-10-CM | POA: Diagnosis not present

## 2017-08-13 DIAGNOSIS — Z885 Allergy status to narcotic agent status: Secondary | ICD-10-CM | POA: Diagnosis not present

## 2017-08-13 DIAGNOSIS — M810 Age-related osteoporosis without current pathological fracture: Secondary | ICD-10-CM | POA: Diagnosis not present

## 2017-08-13 DIAGNOSIS — F0390 Unspecified dementia without behavioral disturbance: Secondary | ICD-10-CM | POA: Diagnosis not present

## 2017-08-13 DIAGNOSIS — K219 Gastro-esophageal reflux disease without esophagitis: Secondary | ICD-10-CM | POA: Diagnosis not present

## 2017-08-13 DIAGNOSIS — Z85828 Personal history of other malignant neoplasm of skin: Secondary | ICD-10-CM | POA: Diagnosis not present

## 2017-08-13 DIAGNOSIS — N131 Hydronephrosis with ureteral stricture, not elsewhere classified: Secondary | ICD-10-CM | POA: Diagnosis not present

## 2017-08-13 DIAGNOSIS — R918 Other nonspecific abnormal finding of lung field: Secondary | ICD-10-CM | POA: Diagnosis not present

## 2017-08-13 DIAGNOSIS — Z96641 Presence of right artificial hip joint: Secondary | ICD-10-CM | POA: Diagnosis not present

## 2017-08-13 DIAGNOSIS — R590 Localized enlarged lymph nodes: Secondary | ICD-10-CM | POA: Diagnosis not present

## 2017-08-13 HISTORY — DX: Unspecified hydronephrosis: N13.30

## 2017-08-13 HISTORY — DX: Intra-abdominal and pelvic swelling, mass and lump, unspecified site: R19.00

## 2017-08-13 LAB — BASIC METABOLIC PANEL
ANION GAP: 15 (ref 5–15)
BUN: 39 mg/dL — ABNORMAL HIGH (ref 6–20)
CO2: 19 mmol/L — AB (ref 22–32)
Calcium: 9.2 mg/dL (ref 8.9–10.3)
Chloride: 107 mmol/L (ref 101–111)
Creatinine, Ser: 4.34 mg/dL — ABNORMAL HIGH (ref 0.44–1.00)
GFR, EST AFRICAN AMERICAN: 10 mL/min — AB (ref >60)
GFR, EST NON AFRICAN AMERICAN: 9 mL/min — AB (ref >60)
GLUCOSE: 136 mg/dL — AB (ref 65–99)
POTASSIUM: 4.6 mmol/L (ref 3.5–5.1)
Sodium: 141 mmol/L (ref 135–145)

## 2017-08-13 MED ORDER — ONDANSETRON HCL 4 MG/2ML IJ SOLN: 4.0000 mg | Freq: Four times a day (QID) | INTRAMUSCULAR | Status: DC | PRN

## 2017-08-13 MED ORDER — ONDANSETRON HCL 4 MG PO TABS
4.0000 mg | ORAL_TABLET | Freq: Four times a day (QID) | ORAL | Status: DC | PRN
Start: 2017-08-13 — End: 2017-08-14

## 2017-08-13 MED ORDER — HEPARIN SODIUM (PORCINE) 5000 UNIT/ML IJ SOLN
5000.0000 [IU] | Freq: Three times a day (TID) | INTRAMUSCULAR | Status: DC
  Administered 2017-08-13: 5000 [IU] via SUBCUTANEOUS
  Filled 2017-08-13: qty 1

## 2017-08-13 MED ORDER — DIPHENHYDRAMINE HCL 25 MG PO CAPS
25.0000 mg | ORAL_CAPSULE | Freq: Every evening | ORAL | Status: DC | PRN
  Administered 2017-08-13: 25 mg via ORAL

## 2017-08-13 MED ORDER — SODIUM CHLORIDE 0.9 % IV SOLN
INTRAVENOUS | Status: DC
Start: 2017-08-13 — End: 2017-08-14
  Administered 2017-08-13: 15:00:00 via INTRAVENOUS

## 2017-08-13 MED ORDER — ACETAMINOPHEN 325 MG PO TABS: 650.0000 mg | ORAL_TABLET | Freq: Four times a day (QID) | ORAL | Status: DC | PRN

## 2017-08-13 MED ORDER — MEMANTINE HCL 5 MG PO TABS
10.0000 mg | ORAL_TABLET | Freq: Two times a day (BID) | ORAL | Status: DC
  Administered 2017-08-13: 20:00:00 10 mg via ORAL
  Filled 2017-08-13 (×2): qty 2

## 2017-08-13 MED ORDER — ACETAMINOPHEN 650 MG RE SUPP: 650.0000 mg | Freq: Four times a day (QID) | RECTAL | Status: DC | PRN

## 2017-08-13 MED ORDER — DIPHENHYDRAMINE HCL 25 MG PO CAPS
ORAL_CAPSULE | ORAL | Status: AC
  Administered 2017-08-13: 25 mg via ORAL
  Filled 2017-08-13: qty 1

## 2017-08-13 NOTE — Telephone Encounter (Signed)
76 year old female status post difficult bilateral ureteral stent secondary to an obstructing pelvic mass yesterday with worsening renal failure.  Creatinine yesterday at the time of stent placement was 4.  Labs were rechecked this morning as an outpatient and her creatinine continues to rise now up to 4.34 despite stent placement.  Her baseline was 0.9 as of 07/2016 has been slowly rising.  With stent placement, would have anticipated improvement today and her overall renal function.  She was offered observation yesterday following her procedure but declined.  I called and discussed today's labs with her husband and healthcare proxy, Jenna Parrish.  At this point in time, I do feel that it is prudent to proceed with admission to the hospital for observation with repeat labs in the morning.  Would like to go ahead and make her n.p.o.  If her creatinine continues to worsen, would strongly recommend bilateral percutaneous nephrostomy tubes in the setting of obstructive renal failure with presumed stent failure.  He is agreeable this plan.  Case dicussed with admitting attending Dr. Cordelia Parrish.  He has agreed to direct admission at this time.  Arrangements were made.  Awaiting placement in bed.  Allergy will see the patient first thing in the morning.  Please make her n.p.o. at midnight and obtain repeat labs with PT/INR in order to make a decision on how to proceed tomorrow morning.  Jenna Espy, MD

## 2017-08-13 NOTE — Telephone Encounter (Addendum)
Received referral from Dr. Ouida Sills for cervical mass. Review of last imaging recommended CT chest/abdomen/pelvis w contrast. Order placed. Pt to have imaging performed prior to new patient appointment w/ Dr. Theora Gianotti on 08/19/17. However, patient had stents placed yesterday with Dr. Erlene Quan. Today, patient's creatinine rising and has pending hospital admission.  Husband called clinic questioning referral to gyn-onc and had questions. Returned call. Discussed that patient was referred for further evaluation and prior to appointment we would like to have CT scans with contrast. However, current kidney function will not allow for use of contrast dye. Will cancel additional imaging at this time but could consider CT chest w/o contrast to further evaluate lung nodules in the future. Will defer to Dr. Erlene Quan and Dr. Ouida Sills at this time. Will continue to follow.   Beckey Rutter, DNP, AGNP-C @TODAY @ 12:34 PM

## 2017-08-13 NOTE — Plan of Care (Signed)
  Progressing Education: Knowledge of General Education information will improve 08/13/2017 1539 - Progressing by Oris Drone, RN Health Behavior/Discharge Planning: Ability to manage health-related needs will improve 08/13/2017 1539 - Progressing by Oris Drone, RN Clinical Measurements: Ability to maintain clinical measurements within normal limits will improve 08/13/2017 1539 - Progressing by Oris Drone, RN Will remain free from infection 08/13/2017 1539 - Progressing by Oris Drone, RN Diagnostic test results will improve 08/13/2017 1539 - Progressing by Oris Drone, RN Nutrition: Adequate nutrition will be maintained 08/13/2017 1539 - Progressing by Oris Drone, RN Pain Managment: General experience of comfort will improve 08/13/2017 1539 - Progressing by Oris Drone, RN Safety: Ability to remain free from injury will improve 08/13/2017 1539 - Progressing by Oris Drone, RN Skin Integrity: Risk for impaired skin integrity will decrease 08/13/2017 1539 - Progressing by Oris Drone, RN

## 2017-08-13 NOTE — H&P (Signed)
[place] [facility] [place] NAME: Jenna Parrish    MR#:  [id number]  DATE OF BIRTH:  [birth year]  DATE OF ADMISSION:  08/13/2017  PRIMARY CARE PHYSICIAN: Adin Hector, MD   REQUESTING/REFERRING PHYSICIAN: Dr Sadie Haber  CHIEF COMPLAINT:  Sent in for acute kidney injury  HISTORY OF PRESENT ILLNESS:  Jenna Parrish  is a 76 y.o. female with a known history of recently diagnosed pelvic mass.  Yesterday she had biopsy of the pelvic mass and bilateral ureteral stents for hydronephrosis.  She had an elevated creatinine at that time and was offered hospital admission but the family declined at that time secondary to her having dementia.  Today she had repeat labs and her creatinine was worse at 4.34 and she was sent in for direct admission.  Patient feels well and offers no complaints.  She actually feels better after the stents were placed yesterday.  She states that she is been urinating.  She has a little blood in the urine.  PAST MEDICAL HISTORY:   Past Medical History:  Diagnosis Date  . Breast cancer ([place])    left lumpectomy with node removal  . Dementia   . GERD (gastroesophageal reflux disease)   . Hydronephrosis   . Pelvic mass     PAST SURGICAL HISTORY:   Past Surgical History:  Procedure Laterality Date  . APPENDECTOMY    . BREAST LUMPECTOMY    . CERVICAL ABLATION N/A 08/12/2017   Procedure: CERVICAL BIOPSY;  Surgeon: Ouida Sills, Gwen Her, MD;  Location: [facility] ORS;  Service: Gynecology;  Laterality: N/A;  . CYSTOSCOPY W/ RETROGRADES Bilateral 08/12/2017   Procedure: CYSTOSCOPY WITH RETROGRADE PYELOGRAM;  Surgeon: [name], MD;  Location: [facility] ORS;  Service: Urology;  Laterality: Bilateral;  . CYSTOSCOPY WITH STENT PLACEMENT Bilateral 08/12/2017   Procedure: CYSTOSCOPY WITH STENT PLACEMENT;  Surgeon: [name], MD;  Location: [facility] ORS;  Service: Urology;  Laterality: Bilateral;    SOCIAL  HISTORY:   Social History   Tobacco Use  . Smoking status: Never Smoker  . Smokeless tobacco: Never Used  Substance Use Topics  . Alcohol use: No    Frequency: Never    FAMILY HISTORY:   Family History  Problem Relation Age of Onset  . Alzheimer's disease Mother   . Heart failure Father   . Bladder Cancer Neg Hx   . Kidney cancer Neg Hx     DRUG ALLERGIES:   Allergies  Allergen Reactions  . Salicylates Hives  . Codeine Other (See Comments)    Unknown  . Donepezil Other (See Comments)    Sedation  . Galantamine Other (See Comments)    Dizziness    REVIEW OF SYSTEMS:  CONSTITUTIONAL: No fever, positive for fatigue.  EYES: No blurred or double vision.  Wears glasses EARS, NOSE, AND THROAT: No tinnitus or ear pain. No sore throat RESPIRATORY: No cough, shortness of breath, wheezing or hemoptysis.  CARDIOVASCULAR: No chest pain, orthopnea, edema.  GASTROINTESTINAL: Positive for nausea.  No vomiting, diarrhea or abdominal pain. No blood in bowel movements GENITOURINARY: No dysuria, positive for hematuria.  ENDOCRINE: No polyuria, nocturia,  HEMATOLOGY: No anemia, easy bruising or bleeding SKIN: No rash or lesion. MUSCULOSKELETAL: No joint pain or arthritis.   NEUROLOGIC: No tingling, numbness, weakness.  PSYCHIATRY: No anxiety or depression.   MEDICATIONS AT HOME:   Prior to Admission medications   Medication Sig Start Date End Date Taking? Authorizing Provider  alendronate (FOSAMAX) 70 MG  tablet TAKE 1 TABLET BY MOUTH EVERY 7 DAYS. TAKE WITH A FULL GLASS OF WATER. DO NOT LIE DOWN FOR THE NEXT 30 MINUTES. 04/06/17   [provider]  Cholecalciferol (D 1000) 1000 units capsule Take by mouth.    [provider]  docusate sodium (COLACE) 100 MG capsule Take 1 capsule (100 mg total) 2 (two) times daily by mouth. 08/12/17   [name], MD  HYDROcodone-acetaminophen (NORCO/VICODIN) 5-325 MG tablet Take 1-2 tablets every 6 (six) hours as needed by  mouth for moderate pain. 08/12/17   [name], MD  memantine [facility]) 10 MG tablet Take 2 (two) times daily by mouth.  08/19/16 08/19/17  [provider]  vitamin B-12 (CYANOCOBALAMIN) 1000 MCG tablet Take 1,000 mcg daily by mouth.    [provider]      VITAL SIGNS:  Blood pressure (!) 159/77, pulse 97, temperature 97.6 F (36.4 C), temperature source Oral, height 5\' 5"  (1.651 m), weight 62.4 kg (137 lb 8 oz), SpO2 99 %.  PHYSICAL EXAMINATION:  GENERAL:  76 y.o.-year-old patient lying in the bed with no acute distress.  EYES: Pupils equal, round, reactive to light and accommodation. No scleral icterus. Extraocular muscles intact.  HEENT: Head atraumatic, normocephalic. Oropharynx and nasopharynx clear.  NECK:  Supple, no jugular venous distention. No thyroid enlargement, no tenderness.  LUNGS: Normal breath sounds bilaterally, no wheezing, rales,rhonchi or crepitation. No use of accessory muscles of respiration.  CARDIOVASCULAR: S1, S2 normal. No murmurs, rubs, or gallops.  ABDOMEN: Soft, nontender, nondistended. Bowel sounds present. No organomegaly or mass.  EXTREMITIES: No pedal edema, cyanosis, or clubbing.  NEUROLOGIC: Cranial nerves II through XII are intact. Muscle strength 5/5 in all extremities. Sensation intact. Gait not checked.  PSYCHIATRIC: The patient is alert and oriented x 3.  SKIN: No rash, lesion, or ulcer.   LABORATORY PANEL:   CBC Recent Labs  Lab 08/12/17 1205  WBC 5.5  HGB 12.6  HCT 37.8  PLT 269   ------------------------------------------------------------------------------------------------------------------  Chemistries  Recent Labs  Lab 08/13/17 0913  NA 141  K 4.6  CL 107  CO2 19*  GLUCOSE 136*  BUN 39*  CREATININE 4.34*  CALCIUM 9.2   ------------------------------------------------------------------------------------------------------------------    RADIOLOGY:  Dg C-arm 1-60 Min-no Report  Result Date:  08/12/2017 Fluoroscopy was utilized by the requesting physician.  No radiographic interpretation.     IMPRESSION AND PLAN:   1.  Acute kidney injury due to obstructive process.  We will give gentle IV fluid hydration. Patient had bilateral ureteral stents yesterday by Dr. Erlene Quan.  Hopefully creatinine should start to improve soon.  Usually kidneys are quick to impair and slow to improve.  If no improvement over time may end up needing nephrostomy tubes.  Of note the patient did have a creatinine of 1.0 on 02/20/2017. 2.  Weakness.  We will get physical therapy evaluation 3.  Dementia on [place]. 4.  Previous history of breast cancer   All the records are reviewed and case discussed with ED provider. Management plans discussed with the patient, family and they are in agreement.  CODE STATUS: Full code TOTAL TIME TAKING CARE OF THIS PATIENT: 50 minutes.    Loletha Grayer M.D on 08/13/2017 at 1:34 PM  Between 7am to 6pm - Pager - [phone]  After 6pm call admission pager [phone]  [facility]  [phone]  CC: Primary care physician; Adin Hector, MD

## 2017-08-13 NOTE — Progress Notes (Signed)
Received referral from Dr. Ouida Sills. Orders placed for CT w contrast chest/abd/pelvis prior to her appt w/ gyn onc on 08/19/17.

## 2017-08-13 NOTE — Op Note (Signed)
NAME:  Jenna Parrish, Jenna Parrish NO.:  [id number]  MEDICAL RECORD NO.:  [id number]  LOCATION:                                 FACILITY:  PHYSICIAN:  Laverta Baltimore, MD     DATE OF BIRTH:  DATE OF PROCEDURE:  08/12/2017 DATE OF DISCHARGE:                              OPERATIVE REPORT   PREOPERATIVE DIAGNOSIS:  Cervical mass, rule out cervical cancer.  POSTOPERATIVE DIAGNOSIS:  Cervical mass, rule out cervical cancer.  PROCEDURE PERFORMED:  Cervical biopsies.  SURGEON:  Laverta Baltimore, MD  ANESTHESIA:  General endotracheal anesthesia.  INDICATIONS:  A 76 year old female, who was seen in the office today previous with bilateral hydronephrosis and bilateral hydroureters.  The patient is in the operating room, being attended to by Urology, Dr. Erlene Quan, who has placed ureteral stents.  Given the patient's discomfort in the office and an uncertain diagnosis of either uterine cancer or cervical cancer, cervical biopsies will be performed.  DESCRIPTION OF PROCEDURE:  After Dr. Erlene Quan has finished with ureteral stenting, speculum was placed into the vagina and Tischler cervical biopsy instrument was used to obtain 2 separate biopsies from the cervix.  Monsel's solution applied to the cervix to aid with bleeding. There was no evidence of endocervical canal, therefore an endometrial biopsy could not be performed.  Limited exposure during the procedure. Bimanual exam revealed large cervical mass extending into the right and left fornix firm and fixed.  There were no complications.  The patient was taken to recovery room in good condition.    ______________________________ Laverta Baltimore, MD   ______________________________ Laverta Baltimore, MD    TS/MEDQ  D:  08/12/2017  T:  08/12/2017  Job:  [id number]

## 2017-08-14 ENCOUNTER — Telehealth: Payer: Self-pay | Admitting: Urology

## 2017-08-14 DIAGNOSIS — N1330 Unspecified hydronephrosis: Secondary | ICD-10-CM

## 2017-08-14 DIAGNOSIS — N179 Acute kidney failure, unspecified: Secondary | ICD-10-CM | POA: Diagnosis not present

## 2017-08-14 LAB — CBC
HEMATOCRIT: 31.9 % — AB (ref 35.0–47.0)
HEMOGLOBIN: 10.6 g/dL — AB (ref 12.0–16.0)
MCH: 31.2 pg (ref 26.0–34.0)
MCHC: 33.3 g/dL (ref 32.0–36.0)
MCV: 93.7 fL (ref 80.0–100.0)
Platelets: 231 10*3/uL (ref 150–440)
RBC: 3.4 MIL/uL — ABNORMAL LOW (ref 3.80–5.20)
RDW: 14.7 % — ABNORMAL HIGH (ref 11.5–14.5)
WBC: 5.4 10*3/uL (ref 3.6–11.0)

## 2017-08-14 LAB — BASIC METABOLIC PANEL
ANION GAP: 8 (ref 5–15)
BUN: 32 mg/dL — ABNORMAL HIGH (ref 6–20)
CHLORIDE: 112 mmol/L — AB (ref 101–111)
CO2: 23 mmol/L (ref 22–32)
Calcium: 7.7 mg/dL — ABNORMAL LOW (ref 8.9–10.3)
Creatinine, Ser: 3.01 mg/dL — ABNORMAL HIGH (ref 0.44–1.00)
GFR calc Af Amer: 16 mL/min — ABNORMAL LOW (ref >60)
GFR, EST NON AFRICAN AMERICAN: 14 mL/min — AB (ref >60)
GLUCOSE: 88 mg/dL (ref 65–99)
POTASSIUM: 3.5 mmol/L (ref 3.5–5.1)
Sodium: 143 mmol/L (ref 135–145)

## 2017-08-14 LAB — PROTIME-INR
INR: 1.04
Prothrombin Time: 13.5 seconds (ref 11.4–15.2)

## 2017-08-14 NOTE — Telephone Encounter (Signed)
I did not order a CT scan.  I ordered a renal ultrasound/ lab for early next week. I still do want this study.  [name], MD

## 2017-08-14 NOTE — Telephone Encounter (Signed)
Patient's husband is calling to see if she still needs to have a CT scan or not? The last notes I see state that they are holding off an any kind of CT scan at this time. Please clarify on what she needs or doesn't need and he wants to know if she needs to keep her appointment with you on the 20th?   Thanks, Sharyn Lull

## 2017-08-14 NOTE — Discharge Summary (Signed)
[place] [facility] [place] NAME: Jenna Parrish    MR#:  [id number]  DATE OF BIRTH:  03-Dec-1940  DATE OF ADMISSION:  08/13/2017 ADMITTING PHYSICIAN: Loletha Grayer, MD  DATE OF DISCHARGE: 08/14/2017  PRIMARY CARE PHYSICIAN: Adin Hector, MD    ADMISSION DIAGNOSIS:  renal failure  DISCHARGE DIAGNOSIS:  Active Problems:   Acute kidney injury ([place])   SECONDARY DIAGNOSIS:   Past Medical History:  Diagnosis Date  . Breast cancer ([place])    left lumpectomy with node removal  . Dementia   . GERD (gastroesophageal reflux disease)   . Hydronephrosis   . Pelvic mass     HOSPITAL COURSE:  76 year old female with a history of mild dementia and pelvic mass who presented with acute kidney injury.  1. Acute kidney injury in the setting of obstructive uropathy. Patient had bilateral urethral stent placed for hydronephrosis on the day of admission and her creatinine has improved since her admission. She will follow-up on Monday for repeat BMP.  2. Pelvic mass: Patient will follow-up with urology  3. Dementia: Continue Namenda  4. History of breast cancer    DISCHARGE CONDITIONS AND DIET:   Stable Regular diet  CONSULTS OBTAINED:  Treatment Team:  [name], MD  DRUG ALLERGIES:   Allergies  Allergen Reactions  . Salicylates Hives  . Aspirin Hives  . Codeine Other (See Comments)    Unknown  . Donepezil Other (See Comments)    Sedation  . Galantamine Other (See Comments)    Dizziness    DISCHARGE MEDICATIONS:   Current Discharge Medication List    CONTINUE these medications which have NOT CHANGED   Details  Cholecalciferol (D 1000) 1000 units capsule Take by mouth.    memantine (NAMENDA) 10 MG tablet Take 2 (two) times daily by mouth.     vitamin B-12 (CYANOCOBALAMIN) 1000 MCG tablet Take 1,000 mcg daily by mouth.      STOP taking these medications     alendronate (FOSAMAX) 70 MG tablet      docusate  sodium (COLACE) 100 MG capsule      HYDROcodone-acetaminophen (NORCO/VICODIN) 5-325 MG tablet           Today   CHIEF COMPLAINT:  Doing well this am    VITAL SIGNS:  Blood pressure (!) 153/71, pulse 77, temperature 98 F (36.7 C), temperature source Oral, resp. rate 18, height 5\' 5"  (1.651 m), weight 62.4 kg (137 lb 8 oz), SpO2 97 %.   REVIEW OF SYSTEMS:  Review of Systems  Constitutional: Negative.  Negative for chills, fever and malaise/fatigue.  HENT: Negative.  Negative for ear discharge, ear pain, hearing loss, nosebleeds and sore throat.   Eyes: Negative.  Negative for blurred vision and pain.  Respiratory: Negative.  Negative for cough, hemoptysis, shortness of breath and wheezing.   Cardiovascular: Negative.  Negative for chest pain, palpitations and leg swelling.  Gastrointestinal: Negative.  Negative for abdominal pain, blood in stool, diarrhea, nausea and vomiting.  Genitourinary: Negative.  Negative for dysuria.  Musculoskeletal: Negative.  Negative for back pain.  Skin: Negative.   Neurological: Negative for dizziness, tremors, speech change, focal weakness, seizures and headaches.  Endo/Heme/Allergies: Negative.  Does not bruise/bleed easily.  Psychiatric/Behavioral: Positive for memory loss. Negative for depression, hallucinations and suicidal ideas.     PHYSICAL EXAMINATION:  GENERAL:  76 y.o.-year-old patient lying in the bed with no acute distress.  NECK:  Supple, no jugular venous distention. No  thyroid enlargement, no tenderness.  LUNGS: Normal breath sounds bilaterally, no wheezing, rales,rhonchi  No use of accessory muscles of respiration.  CARDIOVASCULAR: S1, S2 normal. No murmurs, rubs, or gallops.  ABDOMEN: Soft, non-tender, non-distended. Bowel sounds present. No organomegaly or mass.  EXTREMITIES: No pedal edema, cyanosis, or clubbing.  PSYCHIATRIC: The patient is alert and oriented x 3.  SKIN: No obvious rash, lesion, or ulcer.   DATA  REVIEW:   CBC Recent Labs  Lab 08/14/17 0534  WBC 5.4  HGB 10.6*  HCT 31.9*  PLT 231    Chemistries  Recent Labs  Lab 08/14/17 0534  NA 143  K 3.5  CL 112*  CO2 23  GLUCOSE 88  BUN 32*  CREATININE 3.01*  CALCIUM 7.7*    Cardiac Enzymes No results for input(s): TROPONINI in the last 168 hours.  Microbiology Results  @MICRORSLT48 @  RADIOLOGY:  Dg C-arm 1-60 Min-no Report  Result Date: 08/12/2017 Fluoroscopy was utilized by the requesting physician.  No radiographic interpretation.      Current Discharge Medication List    CONTINUE these medications which have NOT CHANGED   Details  Cholecalciferol (D 1000) 1000 units capsule Take by mouth.    memantine (NAMENDA) 10 MG tablet Take 2 (two) times daily by mouth.     vitamin B-12 (CYANOCOBALAMIN) 1000 MCG tablet Take 1,000 mcg daily by mouth.      STOP taking these medications     alendronate (FOSAMAX) 70 MG tablet      docusate sodium (COLACE) 100 MG capsule      HYDROcodone-acetaminophen (NORCO/VICODIN) 5-325 MG tablet           Management plans discussed with the patient and husband and they are in agreement. Stable for discharge home  Patient should follow up with pcp  CODE STATUS:     Code Status Orders  (From admission, onward)        Start     Ordered   08/13/17 1331  Full code  Continuous     08/13/17 1331    Code Status History    Date Active Date Inactive Code Status Order ID Comments User Context   This patient has a current code status but no historical code status.    Advance Directive Documentation     Most Recent Value  Type of Advance Directive  Healthcare Power of Attorney, Living will  Pre-existing out of facility DNR order (yellow form or pink MOST form)  No data  "MOST" Form in Place?  No data      TOTAL TIME TAKING CARE OF THIS PATIENT: 37 minutes.    Note: This dictation was prepared with Dragon dictation along with smaller phrase technology. Any  transcriptional errors that result from this process are unintentional.  Camala Talwar M.D on 08/14/2017 at 9:22 AM  Between 7am to 6pm - Pager - [phone] After 6pm go to [email or web address] - password [email or web address]  Office  [phone]  CC: Primary care physician; Adin Hector, MD

## 2017-08-14 NOTE — Progress Notes (Signed)
Urology Consult   Subjective: Reports feeling well this morning with no complaints.  Creatinine now down to 3.0 from 4.3.  Urine blood-tinged.  Anti-infectives: Anti-infectives (From admission, onward)   None      Current Facility-Administered Medications  Medication Dose Route Frequency Provider Last Rate Last Dose  . 0.9 %  sodium chloride infusion   Intravenous Continuous Loletha Grayer, MD 50 mL/hr at 08/13/17 1525    . acetaminophen (TYLENOL) tablet 650 mg  650 mg Oral Q6H PRN Loletha Grayer, MD       Or  . acetaminophen (TYLENOL) suppository 650 mg  650 mg Rectal Q6H PRN Wieting, Richard, MD      . diphenhydrAMINE (BENADRYL) capsule 25 mg  25 mg Oral QHS PRN Lance Coon, MD   25 mg at 08/13/17 2324  . heparin injection 5,000 Units  5,000 Units Subcutaneous Q8H Loletha Grayer, MD   5,000 Units at 08/13/17 2021  . memantine (NAMENDA) tablet 10 mg  10 mg Oral BID Loletha Grayer, MD   10 mg at 08/13/17 2021  . ondansetron (ZOFRAN) tablet 4 mg  4 mg Oral Q6H PRN Loletha Grayer, MD       Or  . ondansetron (ZOFRAN) injection 4 mg  4 mg Intravenous Q6H PRN Loletha Grayer, MD         Objective: Vital signs in last 24 hours: Temp:  [97.6 F (36.4 C)-98 F (36.7 C)] 98 F (36.7 C) (11/16 0517) Pulse Rate:  [77-97] 77 (11/16 0517) Resp:  [14-18] 18 (11/16 0517) BP: (134-159)/(71-80) 153/71 (11/16 0517) SpO2:  [97 %-99 %] 97 % (11/16 0517) Weight:  [137 lb 8 oz (62.4 kg)] 137 lb 8 oz (62.4 kg) (11/15 1311)  Intake/Output from previous day: 11/15 0701 - 11/16 0700 In: 631 [P.O.:240; I.V.:637] Out: 675 [Urine:675] Intake/Output this shift: No intake/output data recorded.   Physical Exam  Alert, pleasant, demented.  Husband at bedside. No respiratory distress Abdomen soft, nontender, distended No CVA tenderness No lower extremity edema  Lab Results:  Recent Labs    08/12/17 1205 08/14/17 0534  WBC 5.5 5.4  HGB 12.6 10.6*  HCT 37.8 31.9*  PLT 269 231    BMET Recent Labs    08/13/17 0913 08/14/17 0534  NA 141 143  K 4.6 3.5  CL 107 112*  CO2 19* 23  GLUCOSE 136* 88  BUN 39* 32*  CREATININE 4.34* 3.01*  CALCIUM 9.2 7.7*   PT/INR Recent Labs    08/14/17 0534  LABPROT 13.5  INR 1.04   ABG No results for input(s): PHART, HCO3 in the last 72 hours.  Invalid input(s): PCO2, PO2  Studies/Results: Dg C-arm 1-60 Min-no Report  Result Date: 08/12/2017 Fluoroscopy was utilized by the requesting physician.  No radiographic interpretation.     Assessment: 76 year old female with bilateral ureteral obstruction postop day 2 status post bilateral ureteral stent placement.  She was in renal failure with worsening creatinine yesterday despite placement of bilateral ureteral stent concerning for possible stent failure.  This a.m., her creatinine is starting to trend downward which is reassuring.  Plan: -Appreciate assistance by medicine service to facilitate hospitalization -Given that the creatinine is now trending in the appropriate direction, okay to discharge from a urologic standpoint.  She is scheduled for labs Monday morning as well as a renal ultrasound and will have close follow-up.  Warning symptoms reviewed in detail. -No plan for intervention today given downward trending creatinine.    LOS: 1 day  [name] 08/14/2017

## 2017-08-14 NOTE — Progress Notes (Signed)
Patient discharged home per MD order. All discharge instructions given and all questions answered. 

## 2017-08-14 NOTE — Evaluation (Signed)
Physical Therapy Evaluation Patient Details Name: Jenna Parrish MRN: [id number] DOB: [birth year] Today's Date: 08/14/2017   History of Present Illness  Jenna Parrish is a 76 y.o. female with a known history of recently diagnosed pelvic mass. She had biopsy of the pelvic mass and bilateral ureteral stents for hydronephrosis.  She had an elevated creatinine at that time and was offered hospital admission but the family declined at that time secondary to her having dementia.  She had repeat labs and her creatinine was worse at 4.34 and she was sent in for direct admission. Pt feels better after the stents were placed. She is now admitted for acute kidney injury and weakness. PMH includes breast CA and dementia. Pt is eager to discharge today.  Clinical Impression  Pt admitted with above diagnosis. Pt currently with functional limitations due to the deficits listed below (see PT Problem List).  Pt is modified independent with bed mobility and transfers without an assistive device. She is able to ambulate a full lap around RN station with no assistive device and supervision only. She demonstrates mild lateral gait deviations with horizontal and vertical head turns but able to self correct without external assist. Denies DOE and no signs of respiratory distress. Pt reports that her mobility is baseline. She has no concerns about returning home. She has some higher level balance deficits in single leg stance and with head turns during gait. Pt reports new onset of dizziness 3 weeks ago. States that symptoms occur upon waking and can last for half the day.  Encouraged pt to use single point cane as needed to stabilize with bouts of dizziness during ambulation. She denies other neurological symptoms. Encouraged her to follow-up with upcoming oncology appointments as differential for her dizziness include complications from newly diagnosed pelvic mass and AKI. If no obvious findings to explain dizziness are  discovered she can follow-up with ENT for further testing. Pt is safe to return home with husband at discharge. No further PT needs at this time. Pt will benefit from PT services to address deficits in strength, balance, and mobility in order to return to full function at home.      Follow Up Recommendations No PT follow up    Equipment Recommendations  None recommended by PT;Other (comment)(Use single point cane as needed with dizziness)    Recommendations for Other Services       Precautions / Restrictions Precautions Precautions: Fall Restrictions Weight Bearing Restrictions: No      Mobility  Bed Mobility Overal bed mobility: Modified Independent             General bed mobility comments: Fair speed and sequencing with use of bed rails and HOB elevated  Transfers Overall transfer level: Modified independent Equipment used: None             General transfer comment: Pt able to perform sit to stand transfers with good stability and no external assistance. No safety concerns  Ambulation/Gait Ambulation/Gait assistance: Supervision Ambulation Distance (Feet): 200 Feet Assistive device: None   Gait velocity: Decreased Gait velocity interpretation: <1.8 ft/sec, indicative of risk for recurrent falls General Gait Details: Pt able to ambulate a full lap around RN station with therapy. She demonstrates mild lateral gait deviations with horizontal and vertical head turns but able to self correct without external assist. Denies DOE and no signs of respiratory distress. Pt reports that her mobility is baseline. She has no concerns about returning home  Stairs  Wheelchair Mobility    Modified Rankin (Stroke Patients Only)       Balance Overall balance assessment: Needs assistance Sitting-balance support: No upper extremity supported Sitting balance-Leahy Scale: Good     Standing balance support: No upper extremity supported Standing balance-Leahy  Scale: Fair Standing balance comment: Negative [place]. Single leg balance is 3-4 seconds bilaterally. No history of falls                             Pertinent Vitals/Pain Pain Assessment: No/denies pain    Home Living Family/patient expects to be discharged to:: Private residence Living Arrangements: Spouse/significant other Available Help at Discharge: Family Type of Home: House Home Access: Level entry     Home Layout: [place]: Environmental consultant - 2 wheels;Cane - single point;Bedside commode;Shower seat - built in      Prior Function Level of Independence: Independent         Comments: Pt reports independence with ADLs. Ambulates community distances without assistive device. No falls. Husband assists with IADLs     Hand Dominance   Dominant Hand: Right    Extremity/Trunk Assessment   Upper Extremity Assessment Upper Extremity Assessment: Overall WFL for tasks assessed    Lower Extremity Assessment Lower Extremity Assessment: Overall WFL for tasks assessed       Communication   Communication: No difficulties  Cognition Arousal/Alertness: Awake/alert Behavior During Therapy: WFL for tasks assessed/performed Overall Cognitive Status: History of cognitive impairments - at baseline                                 General Comments: AIOx2 at time of PT evaluation. Disoriented to year and month      General Comments      Exercises     Assessment/Plan    PT Assessment Patent does not need any further PT services  PT Problem List         PT Treatment Interventions      PT Goals (Current goals can be found in the Care Plan section)  Acute Rehab PT Goals PT Goal Formulation: All assessment and education complete, DC therapy    Frequency     Barriers to discharge        Co-evaluation               AM-PAC PT "6 Clicks" Daily Activity  Outcome Measure Difficulty turning over in bed (including adjusting  bedclothes, sheets and blankets)?: None Difficulty moving from lying on back to sitting on the side of the bed? : None Difficulty sitting down on and standing up from a chair with arms (e.g., wheelchair, bedside commode, etc,.)?: None Help needed moving to and from a bed to chair (including a wheelchair)?: None Help needed walking in hospital room?: None Help needed climbing 3-5 steps with a railing? : None 6 Click Score: 24    End of Session Equipment Utilized During Treatment: Gait belt Activity Tolerance: Patient tolerated treatment well Patient left: in bed;with call bell/phone within reach;with bed alarm set;with family/visitor present Nurse Communication: Mobility status PT Visit Diagnosis: Muscle weakness (generalized) (M62.81)    Time: 4270-6237 PT Time Calculation (min) (ACUTE ONLY): 17 min   Charges:   PT Evaluation $PT Eval Low Complexity: 1 Low     PT G Codes:   PT G-Codes **NOT FOR INPATIENT CLASS** Functional Assessment Tool Used: AM-PAC 6 Clicks  Basic Mobility;Clinical judgement Functional Limitation: Mobility: Walking and moving around Mobility: Walking and Moving Around Current Status [phone]): At least 1 percent but less than 20 percent impaired, limited or restricted Mobility: Walking and Moving Around Goal Status [phone]): At least 1 percent but less than 20 percent impaired, limited or restricted Mobility: Walking and Moving Around Discharge Status [phone]): At least 1 percent but less than 20 percent impaired, limited or restricted    Phillips Grout PT, DPT    Waylyn Tenbrink 08/14/2017, 10:25 AM

## 2017-08-15 NOTE — Telephone Encounter (Signed)
I received a message to contact Dr. Ouida Sills regarding Ms. Jenna Parrish. I contacted him and left a voice mail with my cell number.  Gillis Ends, MD

## 2017-08-17 ENCOUNTER — Ambulatory Visit
Admission: RE | Admit: 2017-08-17 | Discharge: 2017-08-17 | Disposition: A | Discharge status: Home / Self Care | Payer: Medicare Other | Source: Ambulatory Visit | Attending: Urology | Admitting: Urology

## 2017-08-17 ENCOUNTER — Other Ambulatory Visit
Admission: RE | Admit: 2017-08-17 | Discharge: 2017-08-17 | Disposition: A | Discharge status: Home / Self Care | Payer: Medicare Other | Source: Ambulatory Visit | Attending: Urology | Admitting: Urology

## 2017-08-17 DIAGNOSIS — N1330 Unspecified hydronephrosis: Secondary | ICD-10-CM

## 2017-08-17 DIAGNOSIS — K8020 Calculus of gallbladder without cholecystitis without obstruction: Secondary | ICD-10-CM | POA: Diagnosis not present

## 2017-08-17 LAB — BASIC METABOLIC PANEL
Anion gap: 14 (ref 5–15)
BUN: 19 mg/dL (ref 6–20)
CHLORIDE: 103 mmol/L (ref 101–111)
CO2: 22 mmol/L (ref 22–32)
Calcium: 9.1 mg/dL (ref 8.9–10.3)
Creatinine, Ser: 1.84 mg/dL — ABNORMAL HIGH (ref 0.44–1.00)
GFR calc Af Amer: 30 mL/min — ABNORMAL LOW (ref >60)
GFR calc non Af Amer: 26 mL/min — ABNORMAL LOW (ref >60)
GLUCOSE: 104 mg/dL — AB (ref 65–99)
Potassium: 3.8 mmol/L (ref 3.5–5.1)
Sodium: 139 mmol/L (ref 135–145)

## 2017-08-17 NOTE — Progress Notes (Addendum)
Gynecologic Oncology Consult Visit   Referring Provider: Boykin Nearing, MD  Chief Concern: Cervical cancer.   Subjective:  Jenna Parrish is a 76 y.o. female who is seen in consultation from Dr. Ouida Sills (Dr. Caryl Comes referred to [facility] initially) for cervical cancer.   She presented to Dr. Altamease Oiler for a pelvic mass. She had been noted to have rising creatinine (4.34) and bilateral hydronephrosis/ hydroureter right>left.   CT A/P scan  08/07/17 shows Dilated ureters terminates at " expanded vaginal dome " . + retroperitoneal adenopathy( aortacaval node 2.1 cm ) and periaortic 1.0 cm . 2 nodules noted in RLL .  IMPRESSION: 1. Bilateral hydronephrosis identified right greater than left. Dilated ureters extend to the level of the pelvis. No obstructing stone identified. Suspicious for distal ureteral obstruction secondary to pelvic mass. 2. Enlargement of the vaginal dome and cervical area. Suspicious for underlying malignancy. Correlation with physical exam findings, pelvic ultrasound and/or contrast enhanced CT of the pelvis is advised. 3. Enlarged retroperitoneal lymph nodes. Suspicious for metastatic adenopathy. 4. There are 2 nonspecific pulmonary nodules identified within the right lower lobe which measure up to 1 cm. Non-contrast chest CT at 3-6 months is recommended. If the nodules are stable at time of repeat CT, then future CT at 18-24 months (from today's scan) is considered optional for low-risk patients, but is recommended for high-risk patients. This recommendation follows the consensus statement: Guidelines for Management of Incidental Pulmonary Nodules Detected on CT Images: From the Fleischner Society 2017; Radiology 2017; 284:228-243.   U/s difficult to perform TVUS due to patient discomfort . Adb u/s shows the endometrial stripe 10.6 mm . Complex 3cm left ovarian cyst internal projection .   On 08/12/2017 she underwent EUA,  cervical biopsies, cystoscopy, bilateral retrograde pyelogram, and bilateral ureteral stent placement for advanced metastatic cervical cancer with bilateral ureteral obstruction and acute renal failure.    Surgical Pathology   DIAGNOSIS:  A. CERVICAL MASS; BIOPSY:  - POORLY DIFFERENTIATED P16 POSITIVE CARCINOMA UNDERMINING INTACT  SQUAMOUS EPITHELIUM.        08/17/2017 Renal ultrasound: Resolution of the hydronephrosis since stent placement. The right kidney appears smaller than the left.. Multiple gallstones are demonstrated.  08/18/2017 Head CT IMPRESSION: No evidence of acute intracranial abnormality. Mild cortical atrophy.  Positive ROS for weight loss, poor appetite, early satiety, and vaginal bleeding  She presents today to discuss treatment options. She has severe dementia and unable to make her own treatment decisions. Her husband is her HCPOA. She has advanced care directives. She is not DNAR.    She also has a h/o breast cancer 2002 with partial left mastectomy , s/p chemoTX and XRT. Her husband has her chemotherapy records and will bring them to the clinic       Problem List: Patient Active Problem List   Diagnosis Date Noted  . Dehydration 08/19/2017  . Malignant neoplasm of cervix ([place]) 08/17/2017  . Acute kidney injury ([place]) 08/13/2017    Past Medical History: Past Medical History:  Diagnosis Date  . Abnormal Pap smear of cervix   . Alzheimer's dementia   . Breast cancer ([place])    left lumpectomy with node removal  . Dehydration 08/19/2017  . Dementia   . GERD (gastroesophageal reflux disease)   . Hydronephrosis   . Pelvic mass     Past Surgical History: Past Surgical History:  Procedure Laterality Date  . APPENDECTOMY    . BREAST LUMPECTOMY    . CERVICAL ABLATION N/A 08/12/2017  Procedure: CERVICAL BIOPSY;  Surgeon: Ouida Sills, Gwen Her, MD;  Location: [facility] ORS;  Service: Gynecology;  Laterality: N/A;  . CYSTOSCOPY W/ RETROGRADES Bilateral  08/12/2017   Procedure: CYSTOSCOPY WITH RETROGRADE PYELOGRAM;  Surgeon: [name], MD;  Location: [facility] ORS;  Service: Urology;  Laterality: Bilateral;  . CYSTOSCOPY WITH STENT PLACEMENT Bilateral 08/12/2017   Procedure: CYSTOSCOPY WITH STENT PLACEMENT;  Surgeon: [name], MD;  Location: [facility] ORS;  Service: Urology;  Laterality: Bilateral;  . TOTAL HIP ARTHROPLASTY Right     Past Gynecologic History:  As per HPI  OB History: G2P2 OB History  No data available    Family History: Family History  Problem Relation Age of Onset  . Alzheimer's disease Mother   . Heart failure Father   . Bladder Cancer Neg Hx   . Kidney cancer Neg Hx     Social History: Social History   Socioeconomic History  . Marital status: Married    Spouse name: Not on file  . Number of children: Not on file  . Years of education: Not on file  . Highest education level: Not on file  Social Needs  . Financial resource strain: Not on file  . Food insecurity - worry: Not on file  . Food insecurity - inability: Not on file  . Transportation needs - medical: Not on file  . Transportation needs - non-medical: Not on file  Occupational History  . Not on file  Tobacco Use  . Smoking status: Never Smoker  . Smokeless tobacco: Never Used  Substance and Sexual Activity  . Alcohol use: No    Frequency: Never  . Drug use: No  . Sexual activity: Not on file  Other Topics Concern  . Not on file  Social History Narrative  . Not on file    Allergies: Allergies  Allergen Reactions  . Salicylates Hives  . Aspirin Hives  . Codeine Other (See Comments)    Unknown  . Donepezil Other (See Comments)    Sedation  . Galantamine Other (See Comments)    Dizziness    Current Medications: Current Outpatient Medications  Medication Sig Dispense Refill  . Cholecalciferol (D 1000) 1000 units capsule Take by mouth.    . vitamin B-12 (CYANOCOBALAMIN) 1000 MCG tablet Take 1,000 mcg daily by mouth.    .  memantine (NAMENDA) 10 MG tablet Take 2 (two) times daily by mouth.      No current facility-administered medications for this visit.     Review of Systems General: weight loss, dizziness, early satiety, poor appetite  HEENT: no complaints  Lungs: no complaints  Cardiac: no complaints  GI: nausea  GU: occasional bleeding from the vagina; no vulvar irritation  Musculoskeletal: no complaints  Extremities: no complaints  Skin: no complaints  Neuro: weakness  Endocrine: no complaints  Psych: no complaints Hem: easy brusing       Objective:  Physical Examination:  BP 128/80 (BP Location: Right Arm, Patient Position: Sitting)   Pulse 87   Temp (!) 96.8 F (36 C) (Tympanic)   Resp 18   Ht 5' 4.5" (1.638 m)   Wt 132 lb (59.9 kg)   BMI 22.31 kg/m    Orthostatics  Laying down 143/75 pulse  79 Sitting 117/71  Pulse 83 Standing- 121/73  Pulse 97     ECOG Performance Status: 2 - Symptomatic, <50% confined to bed  General appearance: alert, cooperative and appears stated age HEENT:PERRLA, extra ocular movement intact and sclera clear, anicteric Lymph node  survey: non-palpable, axillary, inguinal, supraclavicular; thickening of the right groin tissue Cardiovascular: regular rate and rhythm Respiratory: normal air entry, lungs clear to auscultation Abdomen: soft, non-tender, without masses or organomegaly, no hernias and well healed incision Back: inspection of back is normal Extremities: extremities normal, atraumatic, no cyanosis or edema Neurological exam reveals alert, oriented to person not to place or date (not aware of current events; upcoming holidays), normal speech, no focal findings or movement disorder noted.  Pelvic: exam chaperoned by nurse;  Vulva: vulvar erythema but no lesions and o/w normal; Vagina: abnormal firm tumor tissue of the right posterior vaginal wall and difficult speculum exam, on palpation involved the upper anterior and posterior vagina; Adnexa:  unable to determine adnexa size due to large cervical mass; Uterus: uterus is normal size, shape, and nontender, contiguous with the cervical mass; Cervix: 6-8 cm rock hard mass extending to bilateral parametria, almost to the side wall on the right (<1 cm) and involving parametrium on the left; Rectal: confirmed and disease seems to involved rectal muscle but not to the mucosa   Lab Review Chemotherapy labs will be ordered by Dr. Tasia Catchings  Lab Results  Component Value Date   WBC 5.4 08/14/2017   HGB 10.6 (L) 08/14/2017   HCT 31.9 (L) 08/14/2017   MCV 93.7 08/14/2017   PLT 231 08/14/2017     Chemistry      Component Value Date/Time   NA 139 08/17/2017 1154   K 3.8 08/17/2017 1154   CL 103 08/17/2017 1154   CO2 22 08/17/2017 1154   BUN 19 08/17/2017 1154   CREATININE 1.84 (H) 08/17/2017 1154      Component Value Date/Time   CALCIUM 9.1 08/17/2017 1154       Radiologic Imaging: Imaging reviewed.     Assessment:  DAZHA KEMPA is a 76 y.o. female diagnosed with advanced metastatic poorly differentiated cervical cancer (at least IIIC2r) with bilateral hydronephrosis and ARF s/p bilateral stent placement.   ARF improved, creatinine 1.84   Nonspecific pulmonary nodules may represent metastatic disease; if PET positive high likely she has stage IV disease.  Severe dementia and unable to make her own treatment decisions. Her husband is her HCPOA. She has advanced care directives. She is not DNAR.   Dizziness with positive orthostatics, suspect dehydration. Prior work up with head CT negative.   She also has a h/o breast cancer 2002 with partial left mastectomy , s/p chemoTX and XRT. Her husband has her chemotherapy records and will bring them to the clinic    Medical co-morbidities complicating care: h/o breast cancer s/p prior chemotherapy, and dementia. Plan:   Problem List Items Addressed This Visit      Genitourinary   Malignant neoplasm of cervix ([place]) - Primary    Relevant Orders   NM PET Image Initial (PI) Skull Base To Thigh     We reviewed treatment options (chemoradiation vs no therapy). We discussed options for management including concurrent chemotherapy and radiation followed by reassessment imaging and consideration for further chemotherapy. I reviewed the potential benefits of chemotherapy treatment with radiation. The benefits of chemotherapy radiation in terms of disease control and potentially cure if the pulmonary nodules do not represent metastatic disease. She is at risk for complications treatment given her history pf prior chemotherapy, issues related to acute renal failure, and radiation complications (specifically given the involvement of tumor with the adjacent structures). However I do believe she may have considerable benefit in terms of disease control. I  recommended a PET scan to assess disease status further.   I also provided interment alternative such as no treatment and consideration of comfort care measures only with Hospice. However, this is not my first recommendation. If she has progressive disease despite therapy we can discuss Hospice further.   I reviewed health care power of attorney and advanced care directives as well as DNAR status. At this point I think recussication is acceptable as she has Advanced Care Directives in place for withdrawl if there's no improvement. I spoke with the family that if she does have progressive disease and I would recommend to institute DNAR status and provided rational.   Dizziness and orthostatic vital signs. Prior head CT scan was negative. I provided orthostatic precautions and we will give IV fluids in the treatment room today. She'll continue to follow up with Dr. Tasia Catchings closely for electrolyte management and fluids.   ARF, improving, but will limit ability to treat with cisplatin. Carboplatin weekly with AUC=2 is also an option. I will defer to Dr. Tasia Catchings.  Suggested return to clinic in  3 months  for follow up imaging and reassessment.  The patient's diagnosis, an outline of the further diagnostic and laboratory studies which will be required, the recommendation, and alternatives were discussed.  All questions were answered to the patient's satisfaction.  I personally reviewed the patient's history, completed key elements of her exam, and was involved in decision making in conjunction with Ms. Allen.   Gillis Ends, MD  CC:  RMD: Boykin Nearing, MD  PCP: Adin Hector, MD [facility] [place], [place] [address] [phone]

## 2017-08-18 ENCOUNTER — Other Ambulatory Visit: Payer: Self-pay | Admitting: Internal Medicine

## 2017-08-18 ENCOUNTER — Telehealth: Payer: Self-pay | Admitting: Urology

## 2017-08-18 ENCOUNTER — Ambulatory Visit: Payer: Medicare Other

## 2017-08-18 ENCOUNTER — Ambulatory Visit
Admission: RE | Admit: 2017-08-18 | Discharge: 2017-08-18 | Disposition: A | Discharge status: Home / Self Care | Payer: Medicare Other | Source: Ambulatory Visit | Attending: Internal Medicine | Admitting: Internal Medicine

## 2017-08-18 ENCOUNTER — Ambulatory Visit: Payer: Medicare Other | Admitting: Urology

## 2017-08-18 VITALS — BP 136/74 | HR 74 | Ht 65.0 in | Wt 131.6 lb

## 2017-08-18 DIAGNOSIS — G319 Degenerative disease of nervous system, unspecified: Secondary | ICD-10-CM | POA: Diagnosis not present

## 2017-08-18 DIAGNOSIS — R42 Dizziness and giddiness: Secondary | ICD-10-CM | POA: Diagnosis present

## 2017-08-18 DIAGNOSIS — N1330 Unspecified hydronephrosis: Secondary | ICD-10-CM

## 2017-08-18 DIAGNOSIS — N179 Acute kidney failure, unspecified: Secondary | ICD-10-CM

## 2017-08-18 DIAGNOSIS — R1900 Intra-abdominal and pelvic swelling, mass and lump, unspecified site: Secondary | ICD-10-CM | POA: Diagnosis not present

## 2017-08-18 NOTE — Telephone Encounter (Signed)
Opened in error

## 2017-08-18 NOTE — Progress Notes (Signed)
08/18/2017 1:46 PM   Jenna Parrish January 13, 1941 [id number]  Referring provider: Adin Hector, MD [place] [facility] [place], [place] [address]  Chief Complaint  Patient presents with  . Results    HPI: 76 year old female with severe bilateral hydronephrosis secondary to obstructing pelvic tumor.  He was taken to the operating room on 08/12/2017 for bilateral ureteral stent placement as well as cervical biopsy.  Of note, stent placement was technically difficult due to high-grade ureteral obstruction, 4.8 French placed on the right and a 6 [employer] on the left.  At the time, her creatinine was around 4.05.  She declined admission.  She was admitted the following day after creatinine continue to rise up to 4.3 for the following day.  She was admitted for IV hydration and observation overnight.  She was discharged the following day as her creatinine began to trend down.  She returns today for follow-up renal ultrasound and BMP.  As of yesterday, her creatinine was down to 1.84.  Her hydronephrosis had improved dramatically on follow-up renal ultrasound.  Today, she has no complaints.  He is accompanied today by her husband.  She is seeing medical oncology tomorrow.   PMH: Past Medical History:  Diagnosis Date  . Abnormal Pap smear of cervix   . Alzheimer's dementia   . Breast cancer ([place])    left lumpectomy with node removal  . Dehydration 08/19/2017  . Dementia   . GERD (gastroesophageal reflux disease)   . Goals of care, counseling/discussion 08/19/2017  . Hydronephrosis   . Pelvic mass     Surgical History: Past Surgical History:  Procedure Laterality Date  . APPENDECTOMY    . BREAST LUMPECTOMY    . CERVICAL ABLATION N/A 08/12/2017   Procedure: CERVICAL BIOPSY;  Surgeon: Ouida Sills, Gwen Her, MD;  Location: [facility] ORS;  Service: Gynecology;  Laterality: N/A;  . CYSTOSCOPY W/ RETROGRADES Bilateral 08/12/2017   Procedure:  CYSTOSCOPY WITH RETROGRADE PYELOGRAM;  Surgeon: [name], MD;  Location: [facility] ORS;  Service: Urology;  Laterality: Bilateral;  . CYSTOSCOPY WITH STENT PLACEMENT Bilateral 08/12/2017   Procedure: CYSTOSCOPY WITH STENT PLACEMENT;  Surgeon: [name], MD;  Location: [facility] ORS;  Service: Urology;  Laterality: Bilateral;  . TOTAL HIP ARTHROPLASTY Right     Home Medications:  Allergies as of 08/18/2017      Reactions   Salicylates Hives   Aspirin Hives   Codeine Other (See Comments)   Unknown   Donepezil Other (See Comments)   Sedation   Galantamine Other (See Comments)   Dizziness      Medication List        Accurate as of 08/18/17 11:59 PM. Always use your most recent med list.          D 1000 1000 units capsule Generic drug:  Cholecalciferol Take by mouth.   memantine 10 MG tablet Commonly known as:  NAMENDA Take 2 (two) times daily by mouth.   vitamin B-12 1000 MCG tablet Commonly known as:  CYANOCOBALAMIN Take 1,000 mcg daily by mouth.       Allergies:  Allergies  Allergen Reactions  . Salicylates Hives  . Aspirin Hives  . Codeine Other (See Comments)    Unknown  . Donepezil Other (See Comments)    Sedation  . Galantamine Other (See Comments)    Dizziness    Family History: Family History  Problem Relation Age of Onset  . Alzheimer's disease Mother   . Heart failure  Father   . Bladder Cancer Neg Hx   . Kidney cancer Neg Hx     Social History:  reports that  has never smoked. she has never used smokeless tobacco. She reports that she does not drink alcohol or use drugs.  ROS: UROLOGY Frequent Urination?: Yes Hard to postpone urination?: No Burning/pain with urination?: No Get up at night to urinate?: Yes Leakage of urine?: No Urine stream starts and stops?: No Trouble starting stream?: No Do you have to strain to urinate?: No Blood in urine?: Yes Urinary tract infection?: No Sexually transmitted disease?: No Injury to kidneys or  bladder?: No Painful intercourse?: No Weak stream?: No Currently pregnant?: No Vaginal bleeding?: No Last menstrual period?: n  Gastrointestinal Nausea?: Yes Vomiting?: No Indigestion/heartburn?: No Diarrhea?: No Constipation?: No  Constitutional Fever: No Night sweats?: No Weight loss?: No Fatigue?: No  Skin Skin rash/lesions?: No Itching?: No  Eyes Blurred vision?: No Double vision?: No  Ears/Nose/Throat Sore throat?: No Sinus problems?: No  Hematologic/Lymphatic Swollen glands?: No Easy bruising?: No  Cardiovascular Leg swelling?: No Chest pain?: No  Respiratory Cough?: No Shortness of breath?: No  Endocrine Excessive thirst?: No  Musculoskeletal Back pain?: Yes Joint pain?: No  Neurological Headaches?: No Dizziness?: Yes  Psychologic Depression?: No Anxiety?: No  Physical Exam: BP 136/74 (BP Location: Right Arm, Patient Position: Sitting, Cuff Size: Normal)   Pulse 74   Ht 5\' 5"  (1.651 m)   Wt 131 lb 9.6 oz (59.7 kg)   BMI 21.90 kg/m   Constitutional:  Alert and oriented, No acute distress. HEENT: [place] AT, moist mucus membranes.  Trachea midline, no masses. Cardiovascular: No clubbing, cyanosis, or edema. Respiratory: Normal respiratory effort, no increased work of breathing. GI: Abdomen is soft, nontender, nondistended, no abdominal masses GU: No CVA tenderness.  Skin: No rashes, bruises or suspicious lesions. Neurologic: Grossly intact, no focal deficits, moving all 4 extremities. Psychiatric: Normal mood and affect.  Laboratory Data: Lab Results  Component Value Date   WBC 5.4 08/14/2017   HGB 10.6 (L) 08/14/2017   HCT 31.9 (L) 08/14/2017   MCV 93.7 08/14/2017   PLT 231 08/14/2017    Lab Results  Component Value Date   CREATININE 1.84 (H) 08/17/2017    Urinalysis Lab Results  Component Value Date   SPECGRAV 1.010 08/10/2017   PHUR 5.0 08/10/2017   COLORU Yellow 08/10/2017   APPEARANCEUR Clear 08/10/2017    LEUKOCYTESUR 1+ (A) 08/10/2017   PROTEINUR Negative 08/10/2017   GLUCOSEU Negative 08/10/2017   KETONESU Negative 08/10/2017   RBCU Trace (A) 08/10/2017   BILIRUBINUR Negative 08/10/2017   UUROB 0.2 08/10/2017   NITRITE Negative 08/10/2017    Lab Results  Component Value Date   LABMICR See below: 08/10/2017   WBCUA 0-5 08/10/2017   RBCUA 0-2 08/10/2017   LABEPIT None seen 08/10/2017   BACTERIA None seen 08/10/2017    Pertinent Imaging: Results for orders placed during the hospital encounter of 08/17/17  US RENAL   Narrative CLINICAL DATA:  History bilateral hydronephrosis greater on the right than on the left  EXAM: RENAL / URINARY TRACT ULTRASOUND COMPLETE  COMPARISON:  Abdominal and pelvic CT scan of August 07, 2017 and retrograde urogram on the right of August 12, 2017.  FINDINGS: Right Kidney:  Length: 9.0 cm. The renal cortical echotexture is slightly lower than that of the adjacent liver. A ureteral stent is present There is mild cortical thinning. There is no hydronephrosis or perinephric fluid collection.  Left Kidney:  Length: 10.5 cm. Echogenicity within normal limits. No mass or hydronephrosis visualized. A ureteral stent is present.  Bladder:  Ureteral stents are present bilaterally. The partially distended urinary bladder is otherwise normal.  Incidental note is made of gallstones.  IMPRESSION: There has been resolution of the hydronephrosis since stent placement. The right kidney appears smaller than the left.  **An incidental finding of potential clinical significance has been found. Multiple gallstones are demonstrated**.   Electronically Signed   By: David  Martinique M.D.   On: 08/17/2017 15:34    RUS personally reviewed.    Assessment & Plan:    1. Bilateral hydronephrosis Resolved following stent placement She will ultimately need to have her stents exchanged, ~6 weeks from initial placement High risk for stent failure given  degree of obstruction Return to care to discuss stent exchange after the holidays  2. Pelvic mass Seeing medical oncology/ GYN onc tomorrow Treatment plan TBD  3. Acute renal failure, unspecified acute renal failure type [facility]) Improving following renal decompression   Return in about 4 weeks (around 09/15/2017) for discuss stent management.  [name], MD  [facility] [address] [place], [place] [address] [phone]

## 2017-08-19 ENCOUNTER — Institutional Professional Consult (permissible substitution): Payer: Medicare Other | Admitting: Radiation Oncology

## 2017-08-19 ENCOUNTER — Encounter: Payer: Self-pay | Admitting: Oncology

## 2017-08-19 ENCOUNTER — Other Ambulatory Visit: Payer: Self-pay

## 2017-08-19 ENCOUNTER — Inpatient Hospital Stay: Payer: Medicare Other | Attending: Obstetrics and Gynecology | Admitting: Obstetrics and Gynecology

## 2017-08-19 ENCOUNTER — Ambulatory Visit: Payer: Medicare Other

## 2017-08-19 ENCOUNTER — Inpatient Hospital Stay (HOSPITAL_BASED_OUTPATIENT_CLINIC_OR_DEPARTMENT_OTHER): Payer: Medicare Other | Admitting: Oncology

## 2017-08-19 ENCOUNTER — Ambulatory Visit: Admission: RE | Admit: 2017-08-19 | Payer: Medicare Other | Source: Ambulatory Visit | Admitting: Radiation Oncology

## 2017-08-19 ENCOUNTER — Inpatient Hospital Stay: Payer: Medicare Other | Admitting: Oncology

## 2017-08-19 VITALS — BP 128/80 | HR 87 | Temp 96.8°F | Resp 18 | Ht 64.5 in | Wt 132.0 lb

## 2017-08-19 VITALS — BP 128/80 | HR 87 | Temp 96.8°F | Ht 64.5 in | Wt 132.1 lb

## 2017-08-19 DIAGNOSIS — R918 Other nonspecific abnormal finding of lung field: Secondary | ICD-10-CM | POA: Insufficient documentation

## 2017-08-19 DIAGNOSIS — G309 Alzheimer's disease, unspecified: Secondary | ICD-10-CM | POA: Insufficient documentation

## 2017-08-19 DIAGNOSIS — Z923 Personal history of irradiation: Secondary | ICD-10-CM | POA: Diagnosis not present

## 2017-08-19 DIAGNOSIS — Z96641 Presence of right artificial hip joint: Secondary | ICD-10-CM | POA: Diagnosis not present

## 2017-08-19 DIAGNOSIS — C539 Malignant neoplasm of cervix uteri, unspecified: Secondary | ICD-10-CM

## 2017-08-19 DIAGNOSIS — N289 Disorder of kidney and ureter, unspecified: Secondary | ICD-10-CM

## 2017-08-19 DIAGNOSIS — Z9221 Personal history of antineoplastic chemotherapy: Secondary | ICD-10-CM | POA: Insufficient documentation

## 2017-08-19 DIAGNOSIS — N179 Acute kidney failure, unspecified: Secondary | ICD-10-CM | POA: Diagnosis not present

## 2017-08-19 DIAGNOSIS — F0281 Dementia in other diseases classified elsewhere with behavioral disturbance: Secondary | ICD-10-CM

## 2017-08-19 DIAGNOSIS — I700 Atherosclerosis of aorta: Secondary | ICD-10-CM

## 2017-08-19 DIAGNOSIS — F0391 Unspecified dementia with behavioral disturbance: Secondary | ICD-10-CM

## 2017-08-19 DIAGNOSIS — E860 Dehydration: Secondary | ICD-10-CM

## 2017-08-19 DIAGNOSIS — Z79899 Other long term (current) drug therapy: Secondary | ICD-10-CM | POA: Insufficient documentation

## 2017-08-19 DIAGNOSIS — K219 Gastro-esophageal reflux disease without esophagitis: Secondary | ICD-10-CM | POA: Insufficient documentation

## 2017-08-19 DIAGNOSIS — I672 Cerebral atherosclerosis: Secondary | ICD-10-CM | POA: Insufficient documentation

## 2017-08-19 DIAGNOSIS — N1330 Unspecified hydronephrosis: Secondary | ICD-10-CM | POA: Diagnosis not present

## 2017-08-19 DIAGNOSIS — Z853 Personal history of malignant neoplasm of breast: Secondary | ICD-10-CM | POA: Diagnosis not present

## 2017-08-19 DIAGNOSIS — Z7189 Other specified counseling: Secondary | ICD-10-CM

## 2017-08-19 DIAGNOSIS — I951 Orthostatic hypotension: Secondary | ICD-10-CM | POA: Insufficient documentation

## 2017-08-19 DIAGNOSIS — F0280 Dementia in other diseases classified elsewhere without behavioral disturbance: Secondary | ICD-10-CM | POA: Insufficient documentation

## 2017-08-19 HISTORY — DX: Dehydration: E86.0

## 2017-08-19 HISTORY — DX: Other specified counseling: Z71.89

## 2017-08-19 MED ORDER — ONDANSETRON HCL 4 MG PO TABS: 4.0000 mg | ORAL_TABLET | Freq: Three times a day (TID) | ORAL | 0 refills | Status: DC | PRN

## 2017-08-19 MED ORDER — SODIUM CHLORIDE 0.9 % IV SOLN
Freq: Once | INTRAVENOUS | Status: AC
  Administered 2017-08-19: 11:00:00 via INTRAVENOUS
  Filled 2017-08-19: qty 1000

## 2017-08-19 NOTE — Progress Notes (Signed)
Performed Orthostatocs on patient   Laying down 143/75 pulse  79 Sitting 117/71  Pulse 83 Standing- 121/73  Pulse 97

## 2017-08-19 NOTE — Progress Notes (Signed)
  Oncology Nurse Navigator Documentation  Navigator Location: [facility]-Med Onc (08/19/17 1000)   )Navigator Encounter Type: Initial GynOnc (08/19/17 1000)  Chaperoned pelvic exam. She is also seeing Dr. Tasia Catchings and Dr. Baruch Gouty today. Teams aware of each consults.                   Patient Visit Type: Initial;GynOnc (08/19/17 1000)   Barriers/Navigation Needs: (Dementia) (08/19/17 1000)                Acuity: Level 2 (08/19/17 1000)   Acuity Level 2: Initial guidance, education and coordination as needed;Educational needs;Assistance expediting appointments;Ongoing guidance and education throughout treatment as needed (08/19/17 1000)     Time Spent with Patient: 30 (08/19/17 1000)

## 2017-08-19 NOTE — Progress Notes (Signed)
Hematology/Oncology Consult note [facility] Telephone:([phone] [phone]   Patient Care Team: Adin Hector, MD as PCP - General (Internal Medicine)  REFERRING PROVIDER: Schermerhorn, Burman Blacksmith, MD  CHIEF COMPLAINTS/PURPOSE OF CONSULTATION:  Evaluation of Newly diagnosed cervical cancer.  HISTORY OF PRESENTING ILLNESS:  Jenna Parrish is a  76 y.o.  female with PMH listed below who was referred to me for evaluation of newly diagnosed cervical cancer. Patient has dementia. She was accompanied by her husband and daughter. She was seen by GYN oncologist Dr. Virgina Organ Patient started DUE to findings of a rising creatinine and bilateral hydronephrosis. CT scan 08/07/2017 showed dilated ureters terminated at expanded vaginal dome. Positive retroperitoneal adenopathy and 2 nodules in lungs. Patient underwent cervical biopsy cystoscopy and bilateral retrograde pyelogram. Status post bilateral urethral stent placement  Biopsy reviewed cervical cancer. Patient's daughter reports that patient doesn't drink much fluid. She feels dizzy. She was also found to be Orthostatic hypotensive in GYN oncology clinic. Husband reports that she also feels some nausea and is why she doesn't eat. Much. She was prescribed with a trial of meclizine to see if that will help with her dizziness. Patient has a remote history of breast cancer status post lumpectomy and radiation. This was in 2001 in [place] patient also received chemotherapy and husband remember patient had reported neuropathy after the chemotherapy details of the chemotherapy cannot be recalled.     Review of Systems  Constitutional: Positive for weight loss.  HENT: Negative.  Negative for hearing loss.   Eyes: Negative for blurred vision.  Respiratory: Negative for cough.   Cardiovascular: Negative for chest pain.  Skin: Negative.   Neurological: Positive for tingling.  Negative for dizziness.  Endo/Heme/Allergies: Does not bruise/bleed easily.  Psychiatric/Behavioral: Negative for depression.    MEDICAL HISTORY:  Past Medical History:  Diagnosis Date  . Abnormal Pap smear of cervix   . Alzheimer's dementia   . Breast cancer ([place])    left lumpectomy with node removal  . Dehydration 08/19/2017  . Dementia   . GERD (gastroesophageal reflux disease)   . Hydronephrosis   . Pelvic mass     SURGICAL HISTORY: Past Surgical History:  Procedure Laterality Date  . APPENDECTOMY    . BREAST LUMPECTOMY    . CERVICAL ABLATION N/A 08/12/2017   Procedure: CERVICAL BIOPSY;  Surgeon: Ouida Sills, Gwen Her, MD;  Location: [facility] ORS;  Service: Gynecology;  Laterality: N/A;  . CYSTOSCOPY W/ RETROGRADES Bilateral 08/12/2017   Procedure: CYSTOSCOPY WITH RETROGRADE PYELOGRAM;  Surgeon: [name], MD;  Location: [facility] ORS;  Service: Urology;  Laterality: Bilateral;  . CYSTOSCOPY WITH STENT PLACEMENT Bilateral 08/12/2017   Procedure: CYSTOSCOPY WITH STENT PLACEMENT;  Surgeon: [name], MD;  Location: [facility] ORS;  Service: Urology;  Laterality: Bilateral;  . TOTAL HIP ARTHROPLASTY Right     SOCIAL HISTORY: Social History   Socioeconomic History  . Marital status: Married    Spouse name: Not on file  . Number of children: Not on file  . Years of education: Not on file  . Highest education level: Not on file  Social Needs  . Financial resource strain: Not on file  . Food insecurity - worry: Not on file  . Food insecurity - inability: Not on file  . Transportation needs - medical: Not on file  . Transportation needs - non-medical: Not on file  Occupational History  . Not on file  Tobacco Use  . Smoking status: Never Smoker  .  Smokeless tobacco: Never Used  Substance and Sexual Activity  . Alcohol use: No    Frequency: Never  . Drug use: No  . Sexual activity: Not on file  Other Topics Concern  . Not on file  Social History Narrative  . Not  on file    FAMILY HISTORY: Family History  Problem Relation Age of Onset  . Alzheimer's disease Mother   . Heart failure Father   . Bladder Cancer Neg Hx   . Kidney cancer Neg Hx     ALLERGIES:  is allergic to salicylates; aspirin; codeine; donepezil; and galantamine.  MEDICATIONS:  Current Outpatient Medications  Medication Sig Dispense Refill  . Cholecalciferol (D 1000) 1000 units capsule Take by mouth.    . vitamin B-12 (CYANOCOBALAMIN) 1000 MCG tablet Take 1,000 mcg daily by mouth.    . memantine (NAMENDA) 10 MG tablet Take 2 (two) times daily by mouth.     . ondansetron (ZOFRAN) 4 MG tablet Take 1 tablet (4 mg total) by mouth every 8 (eight) hours as needed for nausea or vomiting. 30 tablet 0   No current facility-administered medications for this visit.      PHYSICAL EXAMINATION: ECOG PERFORMANCE STATUS: 1 - Symptomatic but completely ambulatory Vitals:   08/19/17 0849  BP: 128/80  Pulse: 87  Temp: (!) 96.8 F (36 C)   Filed Weights   08/19/17 0848 08/19/17 0849  Weight: 132 lb 2 oz (59.9 kg) 132 lb 2 oz (59.9 kg)    GENERAL: No distress,  SKIN:  No rashes or significant lesions  HEAD: Normocephalic, No masses, lesions, tenderness or abnormalities  EYES: Conjunctiva are pink, non icteric ENT: External ears normal ,lips , buccal mucosa, and tongue normal and mucous membranes are moist  LYMPH: No palpable cervical and axillary lymphadenopathy  LUNGS: Clear to auscultation, no crackles or wheezes HEART: Regular rate & rhythm, no murmurs, no gallops, S1 normal and S2 normal  ABDOMEN: Abdomen soft, non-tender, normal bowel sounds, MUSCULOSKELETAL: No CVA tenderness and no tenderness on percussion of the back or rib cage.  EXTREMITIES: No edema, no skin discoloration or tenderness NEURO: Alert & oriented, left foot sensory intact. Right big toe sensory decreased. rest are normal.     LABORATORY DATA:  I have reviewed the data as listed Lab Results  Component  Value Date   WBC 5.4 08/14/2017   HGB 10.6 (L) 08/14/2017   HCT 31.9 (L) 08/14/2017   MCV 93.7 08/14/2017   PLT 231 08/14/2017   Recent Labs    08/13/17 0913 08/14/17 0534 08/17/17 1154  NA 141 143 139  K 4.6 3.5 3.8  CL 107 112* 103  CO2 19* 23 22  GLUCOSE 136* 88 104*  BUN 39* 32* 19  CREATININE 4.34* 3.01* 1.84*  CALCIUM 9.2 7.7* 9.1  GFRNONAA 9* 14* 26*  GFRAA 10* 16* 30*    CT abdomen pelvis scan 08/07/2017 IMPRESSION: 1. Bilateral hydronephrosis identified right greater than left. Dilated ureters extend to the level of the pelvis. No obstructing stone identified. Suspicious for distal ureteral obstruction secondary to pelvic mass. 2. Enlargement of the vaginal dome and cervical area. Suspicious for underlying malignancy. Correlation with physical exam findings, pelvic ultrasound and/or contrast enhanced CT of the pelvis is advised. 3. Enlarged retroperitoneal lymph nodes. Suspicious for metastatic adenopathy. 4. There are 2 nonspecific pulmonary nodules identified within the right lower lobe which measure up to 1 cm. Non-contrast chest CT at 3-6 months is recommended. If the nodules are stable  at time of repeat CT, then future CT at 18-24 months (from today's scan) is considered optional for low-risk patients, but is recommended for high-risk patients. This recommendation follows the consensus statement: Guidelines for Management of Incidental Pulmonary Nodules Detected on CT Images: From the Fleischner Society 2017; Radiology 2017; 284:228-243.    ASSESSMENT & PLAN:  1. Malignant neoplasm of cervix, unspecified site ([place])   2. Dehydration   3. Renal insufficiency   4. Dementia with behavioral disturbance, unspecified dementia type   5. Goals of care, counseling/discussion     #  image results and the pathology results was discussed with patient. PET scan was ordered by Dr. Theora Gianotti.  She has at least Stage IIIC2r according to 2018 FIGO cervical cancer  staging system.  Patient has at least advanced if not metastatic poorly differentiated cervical cancer., With bilateral hydronephrosis and acute failure. Not a Cisplatin candidate. If lung nodules positive on the PET scan, likely metastatic disease. If negative on PET scan still cannot rule out disease involvement. Theoretically she should have a biopsy of the lung nodules. As she is fairly symptomatic, Also Proceed with Treatment and See If There Are Any Changes with His Lung Nodules.   plan systemic chemotherapy +/- radiation. #  I explained to the patient and her family members the risks and benefits of chemotherapy including all but not limited to hair loss, mouth sore, nausea, vomiting, low blood counts, bleeding, and risk of life threatening infection and even death, secondary malignancy etc.  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 Clinical signs of worsening of neuropathy will need to be closely monitored. As patient is demented I encourage patient's daughter and husband write down if any time patient complained to them about tingling and numbness. They voices understanding. # Will also need to obtain medical records from her previous oncologist in [place] for documentation of previous chemotherapy records. # Chemotherapy education; port placement. Hopefully the planned start chemotherapy next week. Antiemetics-Zofran and Compazine; EMLA cream sent to pharmacy.  # Dizziness and orthostatic vital signs, likely secondary to dehydration. We'll give 1 L of normal saline IV fluids today in the clinic.  # Discussed with GynOnc Dr.Secord, Plan weekly carboplatinum AUC of 2.   All questions were answered. The patient knows to call the clinic with any problems questions or concerns.  Return of visit:  day 1 of chemotherapy treatment.  Thank you for this kind referral and the opportunity to participate in the care of this patient. A copy of today's note is routed to referring  provider    Earlie Server, MD, PhD Hematology Oncology [facility] at [facility] Pager- [phone] 08/19/2017

## 2017-08-19 NOTE — Progress Notes (Signed)
Patient here today as a new patient  

## 2017-08-19 NOTE — Progress Notes (Signed)
START OFF PATHWAY REGIMEN - [Other Dx]   [id number]:Carboplatin AUC=2:   Administer weekly:     Carboplatin   **Always confirm dose/schedule in your pharmacy ordering system**    Patient Characteristics: Intent of Therapy: Non-Curative / Palliative Intent, Discussed with Patient

## 2017-08-24 ENCOUNTER — Encounter (INDEPENDENT_AMBULATORY_CARE_PROVIDER_SITE_OTHER): Payer: Self-pay

## 2017-08-24 NOTE — Progress Notes (Signed)
.     Symptom Management Consult note [facility]  [phone] [phone]  Patient Care Team: Adin Hector, MD as PCP - General (Internal Medicine) Clent Jacks, RN as Registered Nurse   Name of the patient: Jenna Parrish  [id number]  [birth year]   Date of visit: 08/19/17  Diagnosis- Cervical Cancer  Chief complaint/ Reason for visit- Orthostatic Hypotension  Heme/Onc history: Jenna Parrish is a 76 y.o. female diagnosed with advanced metastatic poorly differentiated cervical cancer (at least IIIC2r) with bilateral hydronephrosis and ARF s/p bilateral stent placement.  Patient presented due to findings of a rising creatinine and bilateral hydronephrosis. CT scan 08/07/2017 showed dilated ureters terminated at expanded vaginal dome. Positive retroperitoneal adenopathy and 2 nodules in lungs. Patient underwent cervical biopsy cystoscopy and bilateral retrograde pyelogram. Status post bilateral urethral stent placement  Biopsy reviewed cervical cancer.  ARF improved, creatinine 1.84   Severe dementia and unable to make her own treatment decisions. Her husband is her HCPOA. She has advanced care directives. She is not DNAR.   Dizziness with positive orthostatics, suspect dehydration. Prior work up with head CT negative.   She also has a h/o breast cancer 2002 with partial left mastectomy , s/p chemoTX and XRT. Her husband has her chemotherapy records and will bring them to the clinic    Medical co-morbidities complicating care: h/o breast cancer s/p prior chemotherapy, and dementia.  Interval history- Patient was seen in [place] and Heme Onc and noted to hypotensive. Vitals signs were obtained revealing orthostatic hypotension. Dr. Theora Gianotti and Dr. Tasia Catchings would like patient to receive 1 liter of fluids today.  Patient's daughter reports that patient doesn't drink much fluid or "forgets" to drink fluids due to her worsening  demetia. She feels dizzy often especially when standing. She was also found to be orthostatic. Husband reports that she also has some nausea and this is why she is unable to eat much. She was prescribed a trial of meclizine to see if that will help with her dizziness.  ECOG FS:2 - Symptomatic, <50% confined to bed  Review of systems- Review of Systems  Constitutional: Positive for malaise/fatigue and weight loss. Negative for chills and fever.  HENT: Negative.   Eyes: Negative.   Respiratory: Negative.   Cardiovascular: Negative.   Gastrointestinal: Positive for nausea.  Genitourinary: Negative.   Musculoskeletal: Negative.   Skin: Negative.   Neurological: Positive for dizziness, tingling and weakness.  Psychiatric/Behavioral: Positive for memory loss.     Current treatment- Plan is to have systemic chemotherapy +/- radiation. Carboplatinum AUC beginning Sep 02, 2017.     Allergies  Allergen Reactions  . Salicylates Hives  . Aspirin Hives  . Codeine Other (See Comments)    Unknown  . Donepezil Other (See Comments)    Sedation  . Galantamine Other (See Comments)    Dizziness     Past Medical History:  Diagnosis Date  . Abnormal Pap smear of cervix   . Alzheimer's dementia   . Breast cancer ([place])    left lumpectomy with node removal  . Dehydration 08/19/2017  . Dementia   . GERD (gastroesophageal reflux disease)   . Goals of care, counseling/discussion 08/19/2017  . Hydronephrosis   . Pelvic mass      Past Surgical History:  Procedure Laterality Date  . APPENDECTOMY    . BREAST LUMPECTOMY    . CERVICAL ABLATION N/A 08/12/2017   Procedure: CERVICAL BIOPSY;  Surgeon: Boykin Nearing,  MD;  Location: [facility] ORS;  Service: Gynecology;  Laterality: N/A;  . CYSTOSCOPY W/ RETROGRADES Bilateral 08/12/2017   Procedure: CYSTOSCOPY WITH RETROGRADE PYELOGRAM;  Surgeon: [name], MD;  Location: [facility] ORS;  Service: Urology;  Laterality: Bilateral;  . CYSTOSCOPY  WITH STENT PLACEMENT Bilateral 08/12/2017   Procedure: CYSTOSCOPY WITH STENT PLACEMENT;  Surgeon: [name], MD;  Location: [facility] ORS;  Service: Urology;  Laterality: Bilateral;  . TOTAL HIP ARTHROPLASTY Right     Social History   Socioeconomic History  . Marital status: Married    Spouse name: Not on file  . Number of children: Not on file  . Years of education: Not on file  . Highest education level: Not on file  Social Needs  . Financial resource strain: Not on file  . Food insecurity - worry: Not on file  . Food insecurity - inability: Not on file  . Transportation needs - medical: Not on file  . Transportation needs - non-medical: Not on file  Occupational History  . Not on file  Tobacco Use  . Smoking status: Never Smoker  . Smokeless tobacco: Never Used  Substance and Sexual Activity  . Alcohol use: No    Frequency: Never  . Drug use: No  . Sexual activity: Not on file  Other Topics Concern  . Not on file  Social History Narrative  . Not on file    Family History  Problem Relation Age of Onset  . Alzheimer's disease Mother   . Heart failure Father   . Bladder Cancer Neg Hx   . Kidney cancer Neg Hx      Current Outpatient Medications:  .  Cholecalciferol (D 1000) 1000 units capsule, Take by mouth., Disp: , Rfl:  .  memantine (NAMENDA) 10 MG tablet, Take 2 (two) times daily by mouth. , Disp: , Rfl:  .  ondansetron (ZOFRAN) 4 MG tablet, Take 1 tablet (4 mg total) by mouth every 8 (eight) hours as needed for nausea or vomiting., Disp: 30 tablet, Rfl: 0 .  vitamin B-12 (CYANOCOBALAMIN) 1000 MCG tablet, Take 1,000 mcg daily by mouth., Disp: , Rfl:   Physical exam: There were no vitals filed for this visit. Physical Exam  Constitutional: She is well-developed, well-nourished, and in no distress. She appears dehydrated.  HENT:  Head: Normocephalic and atraumatic.  Eyes: Pupils are equal, round, and reactive to light.  Neck: Normal range of motion. Neck  supple.  Cardiovascular: Normal rate, regular rhythm, normal heart sounds and intact distal pulses.  Pulmonary/Chest: Effort normal and breath sounds normal.  Abdominal: Soft. Bowel sounds are normal.  Musculoskeletal: Normal range of motion.  Neurological: She is alert. She is disoriented.  Skin: Skin is warm and dry. There is pallor.     CMP Latest Ref Rng & Units 08/17/2017  Glucose 65 - 99 mg/dL 104(H)  BUN 6 - 20 mg/dL 19  Creatinine 0.44 - 1.00 mg/dL 1.84(H)  Sodium 135 - 145 mmol/L 139  Potassium 3.5 - 5.1 mmol/L 3.8  Chloride 101 - 111 mmol/L 103  CO2 22 - 32 mmol/L 22  Calcium 8.9 - 10.3 mg/dL 9.1   CBC Latest Ref Rng & Units 08/14/2017  WBC 3.6 - 11.0 K/uL 5.4  Hemoglobin 12.0 - 16.0 g/dL 10.6(L)  Hematocrit 35.0 - 47.0 % 31.9(L)  Platelets 150 - 440 K/uL 231    No images are attached to the encounter.  Ct Abdomen Pelvis Wo Contrast  Result Date: 08/07/2017 CLINICAL DATA:  Evaluate hydronephrosis. EXAM:  CT ABDOMEN AND PELVIS WITHOUT CONTRAST TECHNIQUE: Multidetector CT imaging of the abdomen and pelvis was performed following the standard protocol without IV contrast. COMPARISON:  None. FINDINGS: Lower chest: 2 nodules are identified in the right lower lobe. Index nodule in the posterior right lower lobe measures 1 cm, image 7 of series 3. Hepatobiliary: No focal liver abnormality is seen. No gallstones, gallbladder wall thickening, or biliary dilatation. Pancreas: Unremarkable. No pancreatic ductal dilatation or surrounding inflammatory changes. Spleen: Normal in size without focal abnormality. Adrenals/Urinary Tract: The adrenal glands are unremarkable. Bilateral hydronephrosis and hydroureter identified right greater than left. Dilated right ureter extends into the pelvis where it abruptly terminates near the expanded vaginal dome/cervix, image number 58 of series 2. Similarly the dilated left ureter terminates in the region of the expanded left side of vaginal dome/cervix.  This area is poorly visualized due to lack of IV contrast material and beam hardening artifact from right hip arthroplasty device. Urinary bladder is grossly unremarkable. Stomach/Bowel: The stomach appears normal. The small bowel loops have a normal course and caliber. No pathologic dilatation of the colon. Vascular/Lymphatic: Aortic atherosclerosis. Retroperitoneal adenopathy is identified. Index aortocaval node measures 2.1 cm, image 26 of series 2. Index periaortic node measures 1 cm, image 26 of series 2. Left common iliac node measures 9 mm, image 34 of series 2. Reproductive: The vaginal dome and cervical area appears abnormally expanded measuring 6.5 by 3.7 cm. Cannot rule out underlying mass. Other: No ascites or focal fluid collections identified. Musculoskeletal: Previous right hip arthroplasty device. Advanced degenerative changes involve the left hip. Scoliosis and degenerative disc disease noted. IMPRESSION: 1. Bilateral hydronephrosis identified right greater than left. Dilated ureters extend to the level of the pelvis. No obstructing stone identified. Suspicious for distal ureteral obstruction secondary to pelvic mass. 2. Enlargement of the vaginal dome and cervical area. Suspicious for underlying malignancy. Correlation with physical exam findings, pelvic ultrasound and/or contrast enhanced CT of the pelvis is advised. 3. Enlarged retroperitoneal lymph nodes. Suspicious for metastatic adenopathy. 4. There are 2 nonspecific pulmonary nodules identified within the right lower lobe which measure up to 1 cm. Non-contrast chest CT at 3-6 months is recommended. If the nodules are stable at time of repeat CT, then future CT at 18-24 months (from today's scan) is considered optional for low-risk patients, but is recommended for high-risk patients. This recommendation follows the consensus statement: Guidelines for Management of Incidental Pulmonary Nodules Detected on CT Images: From the Fleischner Society  2017; Radiology 2017; 284:228-243. Electronically Signed   By: Kerby Moors M.D.   On: 08/07/2017 15:26   Ct Head Wo Contrast  Result Date: 08/18/2017 CLINICAL DATA:  Dizziness x1 month, unsteady gait EXAM: CT HEAD WITHOUT CONTRAST TECHNIQUE: Contiguous axial images were obtained from the base of the skull through the vertex without intravenous contrast. COMPARISON:  None. FINDINGS: Brain: No evidence of acute infarction, hemorrhage, hydrocephalus, extra-axial collection or mass lesion/mass effect. Mild cortical atrophy. Vascular: Intracranial atherosclerosis. Skull: Normal. Negative for fracture or focal lesion. Sinuses/Orbits: The visualized paranasal sinuses are essentially clear. The mastoid air cells are unopacified. Other: None. IMPRESSION: No evidence of acute intracranial abnormality. Mild cortical atrophy. These results will be called to the ordering clinician or representative by the Radiology Department at the imaging location. Electronically Signed   By: Julian Hy M.D.   On: 08/18/2017 12:42   US Renal  Result Date: 08/17/2017 CLINICAL DATA:  History bilateral hydronephrosis greater on the right than on the left EXAM:  RENAL / URINARY TRACT ULTRASOUND COMPLETE COMPARISON:  Abdominal and pelvic CT scan of August 07, 2017 and retrograde urogram on the right of August 12, 2017. FINDINGS: Right Kidney: Length: 9.0 cm. The renal cortical echotexture is slightly lower than that of the adjacent liver. A ureteral stent is present There is mild cortical thinning. There is no hydronephrosis or perinephric fluid collection. Left Kidney: Length: 10.5 cm. Echogenicity within normal limits. No mass or hydronephrosis visualized. A ureteral stent is present. Bladder: Ureteral stents are present bilaterally. The partially distended urinary bladder is otherwise normal. Incidental note is made of gallstones. IMPRESSION: There has been resolution of the hydronephrosis since stent placement. The right  kidney appears smaller than the left. **An incidental finding of potential clinical significance has been found. Multiple gallstones are demonstrated**. Electronically Signed   By: David  Martinique M.D.   On: 08/17/2017 15:34   Dg C-arm 1-60 Min-no Report  Result Date: 08/12/2017 Fluoroscopy was utilized by the requesting physician.  No radiographic interpretation.    Assessment, plan and initial evalutaion- Patient is a 76 y.o. female who presents with dizziness/hypotension d/t dehydration. Vitals in Gyn Onc were as follows:  Laying down 143/75 pulse 79 Sitting 117/71 Pulse 83 Standing- 121/73 Pulse 97 Patient appears pale. No labs drawn today. Lungs clear. Bowels active. Heart sounds normal.   1. Give 1 liter Normal Saline in symptom management today.    Final evaluation: Patient appears to feel better. Patient is still slightly orthostatic. Blood Pressure sitting 158/81, pulse 80 and standing 133/78, pulse 87. Encouraged patient to slowly change positions. Encouraged adequate hydration. Patient is scheduled to come back next week for PET scan and Chemo class. She may need additional fluids next week. Asked husband and daughter to call us if patient continues to feel weak and dehydrated.  Patient was given a prescription for meclizine to help with her dizziness. Additionally she was given several anti-emetics in preparation for chemotherapy in a few weeks.     Visit Diagnosis 1. Dehydration     Patient expressed understanding and was in agreement with this plan. She also understands that She can call clinic at any time with any questions, concerns, or complaints.   Greater than 50% was spent in counseling and coordination of care with this patient including but not limited to discussion of the relevant topics above (See A&P) including, but not limited to diagnosis and management of acute and chronic medical conditions.    Faythe Casa, AGNP-C [facility] [place]- [phone] Pager- [phone] 08/24/2017 3:15 PM

## 2017-08-25 ENCOUNTER — Encounter: Payer: Self-pay | Admitting: Radiation Oncology

## 2017-08-25 ENCOUNTER — Other Ambulatory Visit: Payer: Self-pay

## 2017-08-25 ENCOUNTER — Ambulatory Visit
Admission: RE | Admit: 2017-08-25 | Discharge: 2017-08-25 | Disposition: A | Discharge status: Home / Self Care | Payer: Medicare Other | Source: Ambulatory Visit | Attending: Radiation Oncology | Admitting: Radiation Oncology

## 2017-08-25 VITALS — BP 132/74 | HR 94 | Temp 96.5°F | Resp 20 | Wt 132.3 lb

## 2017-08-25 DIAGNOSIS — R944 Abnormal results of kidney function studies: Secondary | ICD-10-CM | POA: Diagnosis not present

## 2017-08-25 DIAGNOSIS — Z79899 Other long term (current) drug therapy: Secondary | ICD-10-CM | POA: Insufficient documentation

## 2017-08-25 DIAGNOSIS — R918 Other nonspecific abnormal finding of lung field: Secondary | ICD-10-CM | POA: Insufficient documentation

## 2017-08-25 DIAGNOSIS — Z853 Personal history of malignant neoplasm of breast: Secondary | ICD-10-CM | POA: Insufficient documentation

## 2017-08-25 DIAGNOSIS — G309 Alzheimer's disease, unspecified: Secondary | ICD-10-CM | POA: Insufficient documentation

## 2017-08-25 DIAGNOSIS — F0280 Dementia in other diseases classified elsewhere without behavioral disturbance: Secondary | ICD-10-CM | POA: Diagnosis not present

## 2017-08-25 DIAGNOSIS — C539 Malignant neoplasm of cervix uteri, unspecified: Secondary | ICD-10-CM

## 2017-08-25 DIAGNOSIS — K219 Gastro-esophageal reflux disease without esophagitis: Secondary | ICD-10-CM | POA: Insufficient documentation

## 2017-08-25 DIAGNOSIS — R590 Localized enlarged lymph nodes: Secondary | ICD-10-CM | POA: Diagnosis not present

## 2017-08-25 DIAGNOSIS — E860 Dehydration: Secondary | ICD-10-CM | POA: Insufficient documentation

## 2017-08-25 DIAGNOSIS — I951 Orthostatic hypotension: Secondary | ICD-10-CM | POA: Diagnosis not present

## 2017-08-25 DIAGNOSIS — N1330 Unspecified hydronephrosis: Secondary | ICD-10-CM | POA: Insufficient documentation

## 2017-08-25 DIAGNOSIS — Z510 Encounter for antineoplastic radiation therapy: Secondary | ICD-10-CM | POA: Diagnosis present

## 2017-08-25 NOTE — Patient Instructions (Signed)

## 2017-08-25 NOTE — Consult Note (Signed)
NEW PATIENT EVALUATION  Name: Jenna Parrish  MRN: [id number]  Date:   08/25/2017     DOB: 1941/06/04   This 76 y.o. female patient presents to the clinic for initial evaluation of at least stage IIIc poorly differentiated cervical carcinoma.Marland Kitchen  REFERRING PHYSICIAN: Adin Hector, MD  CHIEF COMPLAINT:  Chief Complaint  Patient presents with  . Cancer    Initial evaluation    DIAGNOSIS: The encounter diagnosis was Malignant neoplasm of cervix, unspecified site [facility]).   PREVIOUS INVESTIGATIONS:  CT scans reviewed Pathology report reviewed Clinical notes reviewed  HPI: Patient is a 76 year old female with dementia accompanied by her husband and nurse navigator. She has medical comorbidities including orthostatic hypotension as well as previous history of breast cancer status post lumpectomy chemotherapy and radiation therapy. She regionally presented with hydronephrosis as well as rising creatinine status post nephrostomy tube placement. CT scan on 08/07/2017 showed dilated ureters terminating and expanded vaginal dome with significant retroperitoneal adenopathy. She also had periaortic adenopathy as well as 2 nonspecific pulmonary nodule. She underwent an EUA with cervical biopsies positive for poorly differentiated P 16 positive carcinoma. She had stent placed with renal ultrasound showing resolution of the hydronephrosis. Pelvic exam by GYN oncology showed abnormal firm to tumor in the right posterior vaginal wall involving the upper anterior and posterior vagina. Cervix has a 68 cm rockhard mass extending bilaterally into the parametrium. It impinges on the right sidewall. Mass also seen to involve rectal muscle but not to the mucosa. She has been seen by medical oncology and is having a port placed as well as a PET CT scan performed in the next week. She is been planned to have weekly carboplatinum. She is seen today for radiation oncology opinion. She states she is not bleeding  at this time. She is having no pelvic pain. Bowel movements appear unremarkable.  PLANNED TREATMENT REGIMEN: Concurrent chemoradiation and whole pelvic radiation  PAST MEDICAL HISTORY:  has a past medical history of Abnormal Pap smear of cervix, Alzheimer's dementia, Breast cancer ([place]), Dehydration (08/19/2017), Dementia, GERD (gastroesophageal reflux disease), Goals of care, counseling/discussion (08/19/2017), Hydronephrosis, and Pelvic mass.    PAST SURGICAL HISTORY:  Past Surgical History:  Procedure Laterality Date  . APPENDECTOMY    . BREAST LUMPECTOMY    . CERVICAL ABLATION N/A 08/12/2017   Procedure: CERVICAL BIOPSY;  Surgeon: Ouida Sills, Gwen Her, MD;  Location: [facility] ORS;  Service: Gynecology;  Laterality: N/A;  . CYSTOSCOPY W/ RETROGRADES Bilateral 08/12/2017   Procedure: CYSTOSCOPY WITH RETROGRADE PYELOGRAM;  Surgeon: [name], MD;  Location: [facility] ORS;  Service: Urology;  Laterality: Bilateral;  . CYSTOSCOPY WITH STENT PLACEMENT Bilateral 08/12/2017   Procedure: CYSTOSCOPY WITH STENT PLACEMENT;  Surgeon: [name], MD;  Location: [facility] ORS;  Service: Urology;  Laterality: Bilateral;  . TOTAL HIP ARTHROPLASTY Right     FAMILY HISTORY: family history includes Alzheimer's disease in her mother; Heart failure in her father.  SOCIAL HISTORY:  reports that  has never smoked. she has never used smokeless tobacco. She reports that she does not drink alcohol or use drugs.  ALLERGIES: Salicylates; Aspirin; Codeine; Donepezil; and Galantamine  MEDICATIONS:  Current Outpatient Medications  Medication Sig Dispense Refill  . Cholecalciferol (D 1000) 1000 units capsule Take by mouth.    . memantine (NAMENDA) 10 MG tablet Take 2 (two) times daily by mouth.     . ondansetron (ZOFRAN) 4 MG tablet Take 1 tablet (4 mg total) by mouth every 8 (eight) hours  as needed for nausea or vomiting. 30 tablet 0  . vitamin B-12 (CYANOCOBALAMIN) 1000 MCG tablet Take 1,000 mcg daily by mouth.      No current facility-administered medications for this encounter.     ECOG PERFORMANCE STATUS:  1 - Symptomatic but completely ambulatory  REVIEW OF SYSTEMS:  Patient denies any weight loss, fatigue, weakness, fever, chills or night sweats. Patient denies any loss of vision, blurred vision. Patient denies any ringing  of the ears or hearing loss. No irregular heartbeat. Patient denies heart murmur or history of fainting. Patient denies any chest pain or pain radiating to her upper extremities. Patient denies any shortness of breath, difficulty breathing at night, cough or hemoptysis. Patient denies any swelling in the lower legs. Patient denies any nausea vomiting, vomiting of blood, or coffee ground material in the vomitus. Patient denies any stomach pain. Patient states has had normal bowel movements no significant constipation or diarrhea. Patient denies any dysuria, hematuria or significant nocturia. Patient denies any problems walking, swelling in the joints or loss of balance. Patient denies any skin changes, loss of hair or loss of weight. Patient denies any excessive worrying or anxiety or significant depression. Patient denies any problems with insomnia. Patient denies excessive thirst, polyuria, polydipsia. Patient denies any swollen glands, patient denies easy bruising or easy bleeding. Patient denies any recent infections, allergies or URI. Patient "s visual fields have not changed significantly in recent time.    PHYSICAL EXAM: BP 132/74   Pulse 94   Temp (!) 96.5 F (35.8 C)   Resp 20   Wt 132 lb 4.4 oz (60 kg)   BMI 22.35 kg/m  Well-developed well-nourished patient in NAD. HEENT reveals PERLA, EOMI, discs not visualized.  Oral cavity is clear. No oral mucosal lesions are identified. Neck is clear without evidence of cervical or supraclavicular adenopathy. Lungs are clear to A&P. Cardiac examination is essentially unremarkable with regular rate and rhythm without murmur rub or  thrill. Abdomen is benign with no organomegaly or masses noted. Motor sensory and DTR levels are equal and symmetric in the upper and lower extremities. Cranial nerves II through XII are grossly intact. Proprioception is intact. No peripheral adenopathy or edema is identified. No motor or sensory levels are noted. Crude visual fields are within normal range.  LABORATORY DATA: Pathology reports reviewed    RADIOLOGY RESULTS: CT scans reviewed PET CT scan to be reviewed brain scan shows no evidence of metastatic disease   IMPRESSION: At least stage III cervical carcinoma possibly stage IV if lung lesions are PET positive.  PLAN: At this time patient has discussed with GYN oncology as well as medical oncology treatment versus observation or palliative care. Patient and with her husband has decided on treatment. I would plan on delivering 4500 cGy to her pelvis along with concurrent chemotherapy. I would reevaluate after that treatment for treatment response. Certainly would not be able to encompass all other areas of lymph node in the perioral region based on the enormous field patient's age and her ability to tolerate such an extended field of treatment. Risks and benefits of treatment including diarrhea fatigue alteration of blood counts possible increased urgency and frequency of urination skin reaction all were discussed in detail. I would like to evaluate the PET CT scan for targeting of treatment fields. I have set up for next week and ordered CT simulation.There will be extra effort by both professional staff as well as technical staff to coordinate and manage concurrent chemoradiation  and ensuing side effects during her treatments.  I would like to take this opportunity to thank you for allowing me to participate in the care of your patient.Armstead Peaks., MD

## 2017-08-26 ENCOUNTER — Other Ambulatory Visit: Payer: Self-pay

## 2017-08-26 ENCOUNTER — Other Ambulatory Visit (INDEPENDENT_AMBULATORY_CARE_PROVIDER_SITE_OTHER): Payer: Self-pay | Admitting: Vascular Surgery

## 2017-08-26 ENCOUNTER — Inpatient Hospital Stay: Payer: Medicare Other

## 2017-08-26 NOTE — Progress Notes (Signed)
  Oncology Nurse Navigator Documentation  Navigator Location: [facility] Onc (08/26/17 1300)   )Navigator Encounter Type: Initial RadOnc (08/26/17 1300)   Referral placed to PSN for financial concerns                    Patient Visit Type: RadOnc;Initial (08/26/17 1300)                              Time Spent with Patient: 60 (08/26/17 1300)

## 2017-08-27 ENCOUNTER — Telehealth: Payer: Self-pay | Admitting: Urology

## 2017-08-27 ENCOUNTER — Ambulatory Visit
Admission: RE | Admit: 2017-08-27 | Discharge: 2017-08-27 | Disposition: A | Discharge status: Home / Self Care | Payer: Medicare Other | Source: Ambulatory Visit | Attending: Nurse Practitioner | Admitting: Nurse Practitioner

## 2017-08-27 DIAGNOSIS — N1330 Unspecified hydronephrosis: Secondary | ICD-10-CM | POA: Diagnosis not present

## 2017-08-27 DIAGNOSIS — C539 Malignant neoplasm of cervix uteri, unspecified: Secondary | ICD-10-CM | POA: Insufficient documentation

## 2017-08-27 DIAGNOSIS — R918 Other nonspecific abnormal finding of lung field: Secondary | ICD-10-CM | POA: Diagnosis not present

## 2017-08-27 LAB — GLUCOSE, CAPILLARY: GLUCOSE-CAPILLARY: 94 mg/dL (ref 65–99)

## 2017-08-27 MED ORDER — FLUDEOXYGLUCOSE F - 18 (FDG) INJECTION
12.9800 | Freq: Once | INTRAVENOUS | Status: AC | PRN
  Administered 2017-08-27: 12.98 via INTRAVENOUS

## 2017-08-27 NOTE — Telephone Encounter (Signed)
Pt is having a PET scan this morning.  Husband says pt is having back pain.  Do you think they should repeat labwork for creatinine?  [phone] Gwyndolyn Saxon, please give pt's husband a call.

## 2017-08-28 ENCOUNTER — Telehealth: Payer: Self-pay | Admitting: *Deleted

## 2017-08-28 MED ORDER — LIDOCAINE-PRILOCAINE 2.5-2.5 % EX CREA: 1.0000 "application " | TOPICAL_CREAM | CUTANEOUS | 1 refills | Status: AC | PRN

## 2017-08-28 NOTE — Telephone Encounter (Signed)
EMLA cream not at pharmacy. Escribed med

## 2017-08-31 ENCOUNTER — Other Ambulatory Visit: Payer: Self-pay | Admitting: Oncology

## 2017-08-31 DIAGNOSIS — C539 Malignant neoplasm of cervix uteri, unspecified: Secondary | ICD-10-CM

## 2017-08-31 MED ORDER — DEXAMETHASONE 4 MG PO TABS: 8.0000 mg | ORAL_TABLET | Freq: Every day | ORAL | 1 refills | Status: DC

## 2017-08-31 MED ORDER — CEFAZOLIN SODIUM-DEXTROSE 2-4 GM/100ML-% IV SOLN: 2.0000 g | Freq: Once | INTRAVENOUS | Status: DC

## 2017-08-31 MED ORDER — PROCHLORPERAZINE MALEATE 10 MG PO TABS: 10.0000 mg | ORAL_TABLET | Freq: Four times a day (QID) | ORAL | 1 refills | Status: DC | PRN

## 2017-08-31 MED ORDER — LIDOCAINE-PRILOCAINE 2.5-2.5 % EX CREA: TOPICAL_CREAM | CUTANEOUS | 3 refills | Status: DC

## 2017-08-31 MED ORDER — ONDANSETRON HCL 8 MG PO TABS: 8.0000 mg | ORAL_TABLET | Freq: Two times a day (BID) | ORAL | 1 refills | Status: DC | PRN

## 2017-08-31 NOTE — Progress Notes (Signed)
OFF PATHWAY REGIMEN - [Other Dx]  No Change  Continue With Treatment as Ordered.   OFF02260:Carboplatin AUC=2:   Administer weekly:     Carboplatin   **Always confirm dose/schedule in your pharmacy ordering system**    Patient Characteristics: Intent of Therapy: Non-Curative / Palliative Intent, Discussed with Patient

## 2017-08-31 NOTE — Progress Notes (Signed)
DISCONTINUE OFF PATHWAY REGIMEN - [Other Dx]   OFF02260:Carboplatin AUC=2:   Administer weekly:     Carboplatin   **Always confirm dose/schedule in your pharmacy ordering system**    REASON: Other Reason PRIOR TREATMENT: Carboplatin AUC=2 TREATMENT RESPONSE: Unable to Evaluate  START OFF PATHWAY REGIMEN - [Other Dx]   OFF02304:Carboplatin + Paclitaxel (5/175) q21 Days:   A cycle is every 21 days:     Paclitaxel      Carboplatin   **Always confirm dose/schedule in your pharmacy ordering system**    Patient Characteristics: Intent of Therapy: Non-Curative / Palliative Intent, Discussed with Patient

## 2017-09-01 ENCOUNTER — Encounter: Payer: Self-pay | Admitting: Oncology

## 2017-09-01 ENCOUNTER — Encounter
Admission: RE | Disposition: A | Discharge status: Home / Self Care | Payer: Self-pay | Source: Ambulatory Visit | Attending: Vascular Surgery

## 2017-09-01 ENCOUNTER — Ambulatory Visit: Payer: Medicare Other

## 2017-09-01 ENCOUNTER — Ambulatory Visit
Admission: RE | Admit: 2017-09-01 | Discharge: 2017-09-01 | Disposition: A | Discharge status: Home / Self Care | Payer: Medicare Other | Source: Ambulatory Visit | Attending: Vascular Surgery | Admitting: Vascular Surgery

## 2017-09-01 ENCOUNTER — Other Ambulatory Visit: Payer: Self-pay | Admitting: Oncology

## 2017-09-01 ENCOUNTER — Encounter: Payer: Self-pay | Admitting: *Deleted

## 2017-09-01 DIAGNOSIS — N289 Disorder of kidney and ureter, unspecified: Secondary | ICD-10-CM | POA: Insufficient documentation

## 2017-09-01 DIAGNOSIS — F0391 Unspecified dementia with behavioral disturbance: Secondary | ICD-10-CM | POA: Diagnosis not present

## 2017-09-01 DIAGNOSIS — Z888 Allergy status to other drugs, medicaments and biological substances status: Secondary | ICD-10-CM | POA: Insufficient documentation

## 2017-09-01 DIAGNOSIS — C539 Malignant neoplasm of cervix uteri, unspecified: Secondary | ICD-10-CM | POA: Insufficient documentation

## 2017-09-01 DIAGNOSIS — Z8249 Family history of ischemic heart disease and other diseases of the circulatory system: Secondary | ICD-10-CM | POA: Diagnosis not present

## 2017-09-01 DIAGNOSIS — Z9889 Other specified postprocedural states: Secondary | ICD-10-CM | POA: Insufficient documentation

## 2017-09-01 DIAGNOSIS — Z853 Personal history of malignant neoplasm of breast: Secondary | ICD-10-CM | POA: Insufficient documentation

## 2017-09-01 DIAGNOSIS — Z79899 Other long term (current) drug therapy: Secondary | ICD-10-CM | POA: Diagnosis not present

## 2017-09-01 DIAGNOSIS — Z820 Family history of epilepsy and other diseases of the nervous system: Secondary | ICD-10-CM | POA: Insufficient documentation

## 2017-09-01 DIAGNOSIS — K219 Gastro-esophageal reflux disease without esophagitis: Secondary | ICD-10-CM | POA: Diagnosis not present

## 2017-09-01 DIAGNOSIS — Z96641 Presence of right artificial hip joint: Secondary | ICD-10-CM | POA: Insufficient documentation

## 2017-09-01 DIAGNOSIS — Z886 Allergy status to analgesic agent status: Secondary | ICD-10-CM | POA: Diagnosis not present

## 2017-09-01 DIAGNOSIS — E860 Dehydration: Secondary | ICD-10-CM | POA: Insufficient documentation

## 2017-09-01 DIAGNOSIS — Z885 Allergy status to narcotic agent status: Secondary | ICD-10-CM | POA: Insufficient documentation

## 2017-09-01 HISTORY — DX: Malignant neoplasm of cervix uteri, unspecified: C53.9

## 2017-09-01 HISTORY — PX: PORTA CATH INSERTION: CATH118285

## 2017-09-01 SURGERY — PORTA CATH INSERTION
Anesthesia: Moderate Sedation

## 2017-09-01 MED ORDER — ONDANSETRON HCL 4 MG/2ML IJ SOLN: 4.0000 mg | Freq: Four times a day (QID) | INTRAMUSCULAR | Status: DC | PRN

## 2017-09-01 MED ORDER — MIDAZOLAM HCL 5 MG/5ML IJ SOLN
INTRAMUSCULAR | Status: AC
  Filled 2017-09-01: qty 5

## 2017-09-01 MED ORDER — CEFAZOLIN SODIUM-DEXTROSE 1-4 GM/50ML-% IV SOLN
1.0000 g | Freq: Once | INTRAVENOUS | Status: AC
  Administered 2017-09-01: 1 g via INTRAVENOUS

## 2017-09-01 MED ORDER — HYDROMORPHONE HCL 1 MG/ML IJ SOLN: 1.0000 mg | Freq: Once | INTRAMUSCULAR | Status: DC | PRN

## 2017-09-01 MED ORDER — LIDOCAINE-EPINEPHRINE (PF) 1 %-1:200000 IJ SOLN
INTRAMUSCULAR | Status: AC
  Filled 2017-09-01: qty 30

## 2017-09-01 MED ORDER — FENTANYL CITRATE (PF) 100 MCG/2ML IJ SOLN
INTRAMUSCULAR | Status: AC
Start: 2017-09-01 — End: ?
  Filled 2017-09-01: qty 2

## 2017-09-01 MED ORDER — SODIUM CHLORIDE 0.9 % IV SOLN
INTRAVENOUS | Status: DC
  Administered 2017-09-01: 09:00:00 via INTRAVENOUS

## 2017-09-01 MED ORDER — FENTANYL CITRATE (PF) 100 MCG/2ML IJ SOLN
INTRAMUSCULAR | Status: DC | PRN
  Administered 2017-09-01: 50 ug via INTRAVENOUS

## 2017-09-01 MED ORDER — HEPARIN (PORCINE) IN NACL 2-0.9 UNIT/ML-% IJ SOLN
INTRAMUSCULAR | Status: AC
  Filled 2017-09-01: qty 500

## 2017-09-01 MED ORDER — CEFAZOLIN SODIUM-DEXTROSE 1-4 GM/50ML-% IV SOLN
INTRAVENOUS | Status: AC
  Filled 2017-09-01: qty 50

## 2017-09-01 MED ORDER — SODIUM CHLORIDE 0.9 % IR SOLN: Freq: Once | Status: DC

## 2017-09-01 MED ORDER — MIDAZOLAM HCL 2 MG/2ML IJ SOLN
INTRAMUSCULAR | Status: DC | PRN
Start: 2017-09-01 — End: 2017-09-01
  Administered 2017-09-01: 2 mg via INTRAVENOUS

## 2017-09-01 SURGICAL SUPPLY — 9 items
DRAPE INCISE IOBAN 66X45 STRL (DRAPES) ×3 IMPLANT
KIT PORT POWER 8FR ISP CVUE (Miscellaneous) ×3 IMPLANT
NEEDLE ENTRY 21GA 7CM ECHOTIP (NEEDLE) ×3 IMPLANT
PACK ANGIOGRAPHY (CUSTOM PROCEDURE TRAY) ×3 IMPLANT
SET INTRO CAPELLA COAXIAL (SET/KITS/TRAYS/PACK) ×3 IMPLANT
SPONGE XRAY 4X4 16PLY STRL (MISCELLANEOUS) ×3 IMPLANT
SUT MNCRL AB 4-0 PS2 18 (SUTURE) ×3 IMPLANT
SUT PROLENE 0 CT 1 30 (SUTURE) ×3 IMPLANT
SUTURE VIC 3-0 (SUTURE) ×3 IMPLANT

## 2017-09-01 NOTE — Progress Notes (Signed)
Hematology/Oncology Consult note [facility] Telephone:([phone] [phone]   Patient Care Team: Adin Hector, MD as PCP - General (Internal Medicine) Clent Jacks, RN as Registered Nurse  REFERRING PROVIDER: Schermerhorn, Burman Blacksmith, MD  CHIEF COMPLAINTS/PURPOSE OF CONSULTATION:  Evaluation of Newly diagnosed cervical cancer.  HISTORY OF PRESENTING ILLNESS:  Jenna Parrish is a  76 y.o.  female with PMH listed below who was referred to me for evaluation of newly diagnosed cervical cancer. Patient has dementia. She was accompanied by her husband and daughter. She was seen by GYN oncologist Dr. Virgina Organ Patient started DUE to findings of a rising creatinine and bilateral hydronephrosis. CT scan 08/07/2017 showed dilated ureters terminated at expanded vaginal dome. Positive retroperitoneal adenopathy and 2 nodules in lungs. Patient underwent cervical biopsy cystoscopy and bilateral retrograde pyelogram. Status post bilateral urethral stent placement  Biopsy reviewed cervical cancer. Patient's daughter reports that patient doesn't drink much fluid. She feels dizzy. She was also found to be Orthostatic hypotensive in GYN oncology clinic. Husband reports that she also feels some nausea and is why she doesn't eat. Much. She was prescribed with a trial of meclizine to see if that will help with her dizziness. Patient has a remote history of breast cancer status post lumpectomy and radiation. This was in 2001 in [place] patient also received chemotherapy and husband remember patient had reported neuropathy after the chemotherapy details of the chemotherapy cannot be recalled.  INTERVAL HISTORY Jenna Parrish is a 76 y.o. female who has above history reviewed by me today presents for follow up visit for management of cervical cancer. She had PET scan done during the interval which showed clinically metastatic disease.  Today patient is accompanied by her husband and two daughters. Patient denies any urinary symptoms or pain. Husband reports that patient goes to bathroom quite often. Denies fever or chills. Appetite is poor, does not eat much and feels fullness.  Lost 2 pounds since last visit.  She has appointment with Dr.Chrystal for simulation tomorrow.   Review of Systems  Constitutional: Positive for weight loss. Negative for chills and fever.  HENT: Negative.  Negative for hearing loss.   Eyes: Negative for blurred vision and double vision.  Respiratory: Negative for cough and hemoptysis.   Cardiovascular: Negative for chest pain and palpitations.  Gastrointestinal: Positive for nausea. Negative for abdominal pain and heartburn.  Genitourinary: Negative for dysuria.  Musculoskeletal: Negative for myalgias.  Skin: Negative for rash.  Neurological: Positive for tingling. Negative for dizziness and headaches.  Endo/Heme/Allergies: Negative for environmental allergies. Does not bruise/bleed easily.  Psychiatric/Behavioral: Negative for depression and suicidal ideas.    MEDICAL HISTORY:  Past Medical History:  Diagnosis Date  . Abnormal Pap smear of cervix   . Alzheimer's dementia   . Breast cancer ([place])    left lumpectomy with node removal  . Cervical cancer, FIGO stage IVB ([place]) 09/01/2017  . Dehydration 08/19/2017  . Dementia   . GERD (gastroesophageal reflux disease)   . Goals of care, counseling/discussion 08/19/2017  . Hydronephrosis   . Pelvic mass     SURGICAL HISTORY: Past Surgical History:  Procedure Laterality Date  . APPENDECTOMY    . BREAST LUMPECTOMY    . CERVICAL ABLATION N/A 08/12/2017   Procedure: CERVICAL BIOPSY;  Surgeon: Ouida Sills, Gwen Her, MD;  Location: [facility] ORS;  Service: Gynecology;  Laterality: N/A;  . CYSTOSCOPY W/ RETROGRADES Bilateral 08/12/2017   Procedure: CYSTOSCOPY WITH RETROGRADE PYELOGRAM;  Surgeon:  [name], MD;  Location: [facility] ORS;  Service:  Urology;  Laterality: Bilateral;  . CYSTOSCOPY WITH STENT PLACEMENT Bilateral 08/12/2017   Procedure: CYSTOSCOPY WITH STENT PLACEMENT;  Surgeon: [name], MD;  Location: [facility] ORS;  Service: Urology;  Laterality: Bilateral;  . PORTA CATH INSERTION N/A 09/01/2017   Procedure: PORTA CATH INSERTION;  Surgeon: Katha Cabal, MD;  Location: [facility];  Service: Cardiovascular;  Laterality: N/A;  . TOTAL HIP ARTHROPLASTY Right     SOCIAL HISTORY: Social History   Socioeconomic History  . Marital status: Married    Spouse name: Not on file  . Number of children: Not on file  . Years of education: Not on file  . Highest education level: Not on file  Social Needs  . Financial resource strain: Not on file  . Food insecurity - worry: Not on file  . Food insecurity - inability: Not on file  . Transportation needs - medical: Not on file  . Transportation needs - non-medical: Not on file  Occupational History  . Not on file  Tobacco Use  . Smoking status: Never Smoker  . Smokeless tobacco: Never Used  Substance and Sexual Activity  . Alcohol use: No    Frequency: Never  . Drug use: No  . Sexual activity: Not on file  Other Topics Concern  . Not on file  Social History Narrative  . Not on file    FAMILY HISTORY: Family History  Problem Relation Age of Onset  . Alzheimer's disease Mother   . Heart failure Father   . Bladder Cancer Neg Hx   . Kidney cancer Neg Hx     ALLERGIES:  is allergic to salicylates; aspirin; codeine; donepezil; and galantamine.  MEDICATIONS:  No current facility-administered medications for this visit.    No current outpatient medications on file.   Facility-Administered Medications Ordered in Other Visits  Medication Dose Route Frequency Provider Last Rate Last Dose  . 0.9 %  sodium chloride infusion   Intravenous Continuous Stegmayer, Kimberly A, PA-C 75 mL/hr at 09/01/17 0911    . HYDROmorphone (DILAUDID) injection 1 mg  1 mg  Intravenous Once PRN Stegmayer, Kimberly A, PA-C      . ondansetron (ZOFRAN) injection 4 mg  4 mg Intravenous Q6H PRN Stegmayer, Kimberly A, PA-C         PHYSICAL EXAMINATION: ECOG PERFORMANCE STATUS: 3 - Symptomatic, >50% confined to bed Vitals:   09/02/17 0921  BP: 107/72  Pulse: 97  Resp: 16  Temp: (!) 96.5 F (35.8 C)   Filed Weights   09/02/17 0921  Weight: 130 lb (59 kg)   Physical Exam  Constitutional: No distress.  HENT:  Head: Normocephalic and atraumatic.  Eyes: Conjunctivae and EOM are normal. No scleral icterus.  Neck: Normal range of motion. Neck supple.  Cardiovascular: Normal rate and regular rhythm. Exam reveals no friction rub.  No murmur heard. Pulmonary/Chest: Effort normal and breath sounds normal. No respiratory distress.  Abdominal: Soft. Bowel sounds are normal. There is no tenderness.  Musculoskeletal: Normal range of motion. She exhibits no tenderness.  Lymphadenopathy:    She has cervical adenopathy.  Neurological: She is alert. No cranial nerve deficit.  left foot sensory intact. Right big toe sensory decreased. rest are normal.   Skin: Skin is warm and dry.  Psychiatric: Affect normal.   LABORATORY DATA:  I have reviewed the data as listed Lab Results  Component Value Date   WBC 5.6 09/02/2017  HGB 11.4 (L) 09/02/2017   HCT 34.0 (L) 09/02/2017   MCV 92.6 09/02/2017   PLT 276 09/02/2017   Recent Labs    08/14/17 0534 08/17/17 1154 09/02/17 0905  NA 143 139 138  K 3.5 3.8 3.6  CL 112* 103 103  CO2 _0 GLUCOSE 88 104* 187*  BUN 32* 19 27*  CREATININE 3.01* 1.84* 1.61*  CALCIUM 7.7* 9.1 9.1  GFRNONAA 14* 26* 30*  GFRAA 16* 30* 35*  PROT  --   --  7.4  ALBUMIN  --   --  4.1  AST  --   --  28  ALT  --   --  9*  ALKPHOS  --   --  63  BILITOT  --   --  0.6    CT abdomen pelvis scan 08/07/2017 IMPRESSION: 1. Bilateral hydronephrosis identified right greater than left. Dilated ureters extend to the level of the pelvis.  No obstructing stone identified. Suspicious for distal ureteral obstruction secondary to pelvic mass. 2. Enlargement of the vaginal dome and cervical area. Suspicious for underlying malignancy. Correlation with physical exam findings, pelvic ultrasound and/or contrast enhanced CT of the pelvis is advised. 3. Enlarged retroperitoneal lymph nodes. Suspicious for metastatic adenopathy. 4. There are 2 nonspecific pulmonary nodules identified within the right lower lobe which measure up to 1 cm. Non-contrast chest CT at 3-6 months is recommended. If the nodules are stable at time of repeat CT, then future CT at 18-24 months (from today's scan) is considered optional for low-risk patients, but is recommended for high-risk patients. This recommendation follows the consensus statement: Guidelines for Management of Incidental Pulmonary Nodules Detected on CT Images: From the Fleischner Society 2017; Radiology 2017; 284:228-243.    ASSESSMENT & PLAN:  76 yo female with history of dementia presents for management of clinically metastatic FIGO stage IVB cervical cancer.  1. Cervical cancer, FIGO stage IVB ([place])   2. Renal insufficiency   3. Dehydration   4. Goals of care, counseling/discussion     #  PET scan results were discussed with patient and her family. She has clinically FIGO stage IVB cervical cancer disease.  Biopsy of distant metastasis for confirmation is desired, however, considering her symptomatic disease needing palliative treatment and knowing additional information does not change her treatment plan (cocurrent RT with carboplatin AUC 2)  at this point .will hold the biopsy.   # Discussed with Dr.Secord who agrees with the above plan. Discussed with Dr.Chrystal and patient will get simulation tomorrow and RT likely to start next week, plan carboplatin weekly AUC 2 started in 1 week.   # Send foundation one test and PD-L1.   I explained to the patient the risks and benefits of  chemotherapy including all but not limited to hair loss, mouth sore, nausea, vomiting, low blood counts, bleeding, and risk of life threatening infection and even death, secondary malignancy etc.   # Antiemetics-Zofran and Compazine; EMLA cream sent to pharmacy   # medical records from her previous oncologist in [place] reviewed. She had T1N1 Stage II ER positive left breast cancer ([facility]) diagnosed in 2001. She underwent lumpectomy followed by chemotherapy and radiation to lumpectomy site. Chemotherapy includes ACx 4, taxol x 4, completed February 2002, She finished her 5 years adjuvant endocrine treatment with Tamoxifen in 2007.   All questions were answered. The patient knows to call the clinic with any problems questions or concerns.  Return of visit:  1 week to start weekly  carboplatin.  Total face to face encounter time for this patient visit was 40  min. >50% of the time was  spent in counseling and coordination of care.  Thank you for this kind referral and the opportunity to participate in the care of this patient. A copy of today's note is routed to referring provider    Earlie Server, MD, PhD Hematology Oncology [facility] at [facility] Pager- [phone]

## 2017-09-01 NOTE — Op Note (Signed)
OPERATIVE NOTE   PROCEDURE: 1. Placement of a right IJ Infuse-a-Port  PRE-OPERATIVE DIAGNOSIS: Cervical carcinoma  POST-OPERATIVE DIAGNOSIS: Same  SURGEON: Katha Cabal M.D.  ANESTHESIA: Conscious sedation was administered under my direct supervision by the interventional radiology RN. IV Versed plus fentanyl were utilized. Continuous ECG, pulse oximetry and blood pressure was monitored throughout the entire procedure. Conscious sedation was for a total of 31 minutes.  ESTIMATED BLOOD LOSS: Minimal   FINDING(S): 1.  Patent vein  SPECIMEN(S): None  INDICATIONS:   Jenna Parrish is a 76 y.o. female who presents with cervical carcinoma.  She now requires chemotherapy and therefore requires appropriate intravenous access.  DESCRIPTION: After obtaining full informed written consent, the patient was brought back to the special procedure suite and placed in the supine position. The patient's right neck and chest wall are prepped and draped in sterile fashion. Appropriate timeout was called.  Ultrasound is placed in a sterile sleeve, ultrasound is utilized to avoid vascular injury as well as secondary to lack of appropriate landmarks. The right internal jugular vein is identified. It is echolucent and homogeneous as well as easily compressible indicating patency. An image is recorded for the permanent record.  Access to the vein with a micropuncture needle is done under direct ultrasound visualization.  1% lidocaine is infiltrated into the soft tissue at the base of the neck as well as on the chest wall.  Under direct ultrasound visualization a micro-needle is inserted into the vein followed by the micro-wire. Micro-sheath was then advanced and a J wire is inserted without difficulty under fluoroscopic guidance. A small counterincision was created at the wire insertion site. A transverse incision is created 2 fingerbreadths below the scapula and a pocket is fashioned using both blunt  and sharp dissection. The pocket is tested for appropriate size with the hub of the Infuse-a-Port. The tunneling device is then used to pull the intravascular portion of the catheter from the pocket to the neck counterincision.  Dilator and peel-away sheath were then inserted over the wire and the wire is removed. Catheter is then advanced into the venous system without difficulty. Peel-away sheath was then removed.  Catheter is then positioned under fluoroscopic guidance at the atrial caval junction. It is then transected connected to the hub and the hope is slipped into the subcutaneous pocket on the chest wall. The hub was then accessed percutaneously and aspirates easily and flushes well and is flushed with 30 cc of heparinized saline. The pocket incision is then closed in layers using interrupted 3-0 Vicryl for the subcutaneous tissues and 4-0 Monocryl subcuticular for skin closure. Dermabond is applied. The neck counterincision was closed with 4-0 Monocryl subcuticular and Dermabond as well.  The patient tolerated the procedure well and there were no immediate complications.  COMPLICATIONS: None  CONDITION: Unchanged  Katha Cabal M.D. [facility] Office: [phone]   09/01/2017, 11:03 AM

## 2017-09-01 NOTE — Progress Notes (Signed)
DISCONTINUE OFF PATHWAY REGIMEN - [Other Dx]   OFF02304:Carboplatin + Paclitaxel (5/175) q21 Days:   A cycle is every 21 days:     Paclitaxel      Carboplatin   **Always confirm dose/schedule in your pharmacy ordering system**    REASON: Other Reason PRIOR TREATMENT: Carboplatin + Paclitaxel (5/175) q21 Days TREATMENT RESPONSE: Unable to Evaluate  START OFF PATHWAY REGIMEN - [Other Dx]   OFF02260:Carboplatin AUC=2:   Administer weekly:     Carboplatin   **Always confirm dose/schedule in your pharmacy ordering system**    Patient Characteristics: Intent of Therapy: Non-Curative / Palliative Intent, Discussed with Patient

## 2017-09-01 NOTE — Progress Notes (Signed)
OFF PATHWAY REGIMEN - [Other Dx]  No Change  Continue With Treatment as Ordered.   OFF02304:Carboplatin + Paclitaxel (5/175) q21 Days:   A cycle is every 21 days:     Paclitaxel      Carboplatin   **Always confirm dose/schedule in your pharmacy ordering system**  Patient Characteristics: Intent of Therapy: Non-Curative / Palliative Intent, Discussed with Patient 

## 2017-09-01 NOTE — H&P (Signed)
[facility] History & Physical Update  The patient was interviewed and re-examined.  The patient's previous History and Physical has been reviewed and is unchanged.  There is no change in the plan of care. We plan to proceed with the scheduled procedure.  Hortencia Pilar, MD  09/01/2017, 10:08 AM

## 2017-09-02 ENCOUNTER — Encounter: Payer: Self-pay | Admitting: Oncology

## 2017-09-02 ENCOUNTER — Inpatient Hospital Stay: Payer: Medicare Other | Attending: Oncology | Admitting: Oncology

## 2017-09-02 ENCOUNTER — Inpatient Hospital Stay: Payer: Medicare Other

## 2017-09-02 VITALS — BP 107/72 | HR 97 | Temp 96.5°F | Resp 16 | Wt 130.0 lb

## 2017-09-02 DIAGNOSIS — C539 Malignant neoplasm of cervix uteri, unspecified: Secondary | ICD-10-CM | POA: Diagnosis not present

## 2017-09-02 DIAGNOSIS — E860 Dehydration: Secondary | ICD-10-CM | POA: Diagnosis not present

## 2017-09-02 DIAGNOSIS — G309 Alzheimer's disease, unspecified: Secondary | ICD-10-CM | POA: Diagnosis not present

## 2017-09-02 DIAGNOSIS — I951 Orthostatic hypotension: Secondary | ICD-10-CM | POA: Insufficient documentation

## 2017-09-02 DIAGNOSIS — F0280 Dementia in other diseases classified elsewhere without behavioral disturbance: Secondary | ICD-10-CM | POA: Diagnosis not present

## 2017-09-02 DIAGNOSIS — N289 Disorder of kidney and ureter, unspecified: Secondary | ICD-10-CM

## 2017-09-02 DIAGNOSIS — Z9221 Personal history of antineoplastic chemotherapy: Secondary | ICD-10-CM | POA: Diagnosis not present

## 2017-09-02 DIAGNOSIS — Z79899 Other long term (current) drug therapy: Secondary | ICD-10-CM | POA: Diagnosis not present

## 2017-09-02 DIAGNOSIS — K219 Gastro-esophageal reflux disease without esophagitis: Secondary | ICD-10-CM | POA: Insufficient documentation

## 2017-09-02 DIAGNOSIS — Z853 Personal history of malignant neoplasm of breast: Secondary | ICD-10-CM | POA: Diagnosis not present

## 2017-09-02 DIAGNOSIS — N1330 Unspecified hydronephrosis: Secondary | ICD-10-CM | POA: Diagnosis not present

## 2017-09-02 DIAGNOSIS — Z923 Personal history of irradiation: Secondary | ICD-10-CM | POA: Diagnosis not present

## 2017-09-02 DIAGNOSIS — Z170 Estrogen receptor positive status [ER+]: Secondary | ICD-10-CM | POA: Diagnosis not present

## 2017-09-02 DIAGNOSIS — Z9223 Personal history of estrogen therapy: Secondary | ICD-10-CM | POA: Diagnosis not present

## 2017-09-02 DIAGNOSIS — Z7189 Other specified counseling: Secondary | ICD-10-CM

## 2017-09-02 LAB — CBC WITH DIFFERENTIAL/PLATELET
BASOS PCT: 1 %
Basophils Absolute: 0 10*3/uL (ref 0–0.1)
Eosinophils Absolute: 0.1 10*3/uL (ref 0–0.7)
Eosinophils Relative: 3 %
HEMATOCRIT: 34 % — AB (ref 35.0–47.0)
HEMOGLOBIN: 11.4 g/dL — AB (ref 12.0–16.0)
LYMPHS ABS: 0.7 10*3/uL — AB (ref 1.0–3.6)
Lymphocytes Relative: 13 %
MCH: 31.1 pg (ref 26.0–34.0)
MCHC: 33.6 g/dL (ref 32.0–36.0)
MCV: 92.6 fL (ref 80.0–100.0)
MONOS PCT: 7 %
Monocytes Absolute: 0.4 10*3/uL (ref 0.2–0.9)
NEUTROS ABS: 4.3 10*3/uL (ref 1.4–6.5)
NEUTROS PCT: 76 %
Platelets: 276 10*3/uL (ref 150–440)
RBC: 3.67 MIL/uL — ABNORMAL LOW (ref 3.80–5.20)
RDW: 14.3 % (ref 11.5–14.5)
WBC: 5.6 10*3/uL (ref 3.6–11.0)

## 2017-09-02 LAB — COMPREHENSIVE METABOLIC PANEL
ALT: 9 U/L — ABNORMAL LOW (ref 14–54)
ANION GAP: 11 (ref 5–15)
AST: 28 U/L (ref 15–41)
Albumin: 4.1 g/dL (ref 3.5–5.0)
Alkaline Phosphatase: 63 U/L (ref 38–126)
BUN: 27 mg/dL — ABNORMAL HIGH (ref 6–20)
CHLORIDE: 103 mmol/L (ref 101–111)
CO2: 24 mmol/L (ref 22–32)
Calcium: 9.1 mg/dL (ref 8.9–10.3)
Creatinine, Ser: 1.61 mg/dL — ABNORMAL HIGH (ref 0.44–1.00)
GFR calc non Af Amer: 30 mL/min — ABNORMAL LOW (ref >60)
GFR, EST AFRICAN AMERICAN: 35 mL/min — AB (ref >60)
Glucose, Bld: 187 mg/dL — ABNORMAL HIGH (ref 65–99)
Potassium: 3.6 mmol/L (ref 3.5–5.1)
SODIUM: 138 mmol/L (ref 135–145)
Total Bilirubin: 0.6 mg/dL (ref 0.3–1.2)
Total Protein: 7.4 g/dL (ref 6.5–8.1)

## 2017-09-02 MED ORDER — HEPARIN SOD (PORK) LOCK FLUSH 100 UNIT/ML IV SOLN
500.0000 [IU] | Freq: Once | INTRAVENOUS | Status: AC
  Administered 2017-09-02: 500 [IU] via INTRAVENOUS
  Filled 2017-09-02: qty 5

## 2017-09-02 MED ORDER — ONDANSETRON HCL 4 MG PO TABS: 4.0000 mg | ORAL_TABLET | Freq: Four times a day (QID) | ORAL | 2 refills | Status: AC | PRN

## 2017-09-02 MED ORDER — DEXAMETHASONE 4 MG PO TABS: 8.0000 mg | ORAL_TABLET | ORAL | 0 refills | Status: DC

## 2017-09-02 MED ORDER — SODIUM CHLORIDE 0.9% FLUSH
10.0000 mL | Freq: Once | INTRAVENOUS | Status: AC
  Administered 2017-09-02: 10 mL via INTRAVENOUS
  Filled 2017-09-02: qty 10

## 2017-09-02 MED ORDER — LIDOCAINE-PRILOCAINE 2.5-2.5 % EX CREA: TOPICAL_CREAM | Freq: Every day | CUTANEOUS | Status: DC | PRN

## 2017-09-02 MED ORDER — PROCHLORPERAZINE MALEATE 10 MG PO TABS: 10.0000 mg | ORAL_TABLET | Freq: Four times a day (QID) | ORAL | 0 refills | Status: AC | PRN

## 2017-09-02 NOTE — Progress Notes (Signed)
Patient here for follow up with labs and initiation of treatment with Carboplatin and Taxol. She states that she is feeling well today and denies having any pain. She is accompanied today by her husband and two daughters. Patient has a history of dementia.

## 2017-09-03 ENCOUNTER — Ambulatory Visit
Admission: RE | Admit: 2017-09-03 | Discharge: 2017-09-03 | Disposition: A | Discharge status: Home / Self Care | Payer: Medicare Other | Source: Ambulatory Visit | Attending: Radiation Oncology | Admitting: Radiation Oncology

## 2017-09-03 DIAGNOSIS — Z510 Encounter for antineoplastic radiation therapy: Secondary | ICD-10-CM | POA: Diagnosis not present

## 2017-09-08 ENCOUNTER — Other Ambulatory Visit: Payer: Self-pay

## 2017-09-08 ENCOUNTER — Telehealth: Payer: Self-pay | Admitting: *Deleted

## 2017-09-08 ENCOUNTER — Encounter: Payer: Self-pay | Admitting: Emergency Medicine

## 2017-09-08 ENCOUNTER — Emergency Department: Payer: Medicare Other

## 2017-09-08 ENCOUNTER — Inpatient Hospital Stay
Admission: EM | Admit: 2017-09-08 | Discharge: 2017-09-12 | DRG: 682 | Disposition: A | Discharge status: Home / Self Care | Payer: Medicare Other | Attending: Specialist | Admitting: Specialist

## 2017-09-08 DIAGNOSIS — Z96641 Presence of right artificial hip joint: Secondary | ICD-10-CM | POA: Diagnosis present

## 2017-09-08 DIAGNOSIS — Z886 Allergy status to analgesic agent status: Secondary | ICD-10-CM | POA: Diagnosis not present

## 2017-09-08 DIAGNOSIS — R627 Adult failure to thrive: Secondary | ICD-10-CM | POA: Diagnosis present

## 2017-09-08 DIAGNOSIS — Z515 Encounter for palliative care: Secondary | ICD-10-CM | POA: Diagnosis not present

## 2017-09-08 DIAGNOSIS — E860 Dehydration: Secondary | ICD-10-CM | POA: Diagnosis present

## 2017-09-08 DIAGNOSIS — N183 Chronic kidney disease, stage 3 (moderate): Secondary | ICD-10-CM | POA: Diagnosis present

## 2017-09-08 DIAGNOSIS — R531 Weakness: Secondary | ICD-10-CM | POA: Diagnosis not present

## 2017-09-08 DIAGNOSIS — R410 Disorientation, unspecified: Secondary | ICD-10-CM | POA: Diagnosis present

## 2017-09-08 DIAGNOSIS — F0281 Dementia in other diseases classified elsewhere with behavioral disturbance: Secondary | ICD-10-CM | POA: Diagnosis not present

## 2017-09-08 DIAGNOSIS — Z79899 Other long term (current) drug therapy: Secondary | ICD-10-CM | POA: Diagnosis not present

## 2017-09-08 DIAGNOSIS — F0280 Dementia in other diseases classified elsewhere without behavioral disturbance: Secondary | ICD-10-CM | POA: Diagnosis present

## 2017-09-08 DIAGNOSIS — C539 Malignant neoplasm of cervix uteri, unspecified: Secondary | ICD-10-CM | POA: Diagnosis present

## 2017-09-08 DIAGNOSIS — C770 Secondary and unspecified malignant neoplasm of lymph nodes of head, face and neck: Secondary | ICD-10-CM | POA: Diagnosis not present

## 2017-09-08 DIAGNOSIS — G309 Alzheimer's disease, unspecified: Secondary | ICD-10-CM | POA: Diagnosis present

## 2017-09-08 DIAGNOSIS — E43 Unspecified severe protein-calorie malnutrition: Secondary | ICD-10-CM | POA: Diagnosis present

## 2017-09-08 DIAGNOSIS — I700 Atherosclerosis of aorta: Secondary | ICD-10-CM | POA: Diagnosis not present

## 2017-09-08 DIAGNOSIS — K219 Gastro-esophageal reflux disease without esophagitis: Secondary | ICD-10-CM | POA: Diagnosis present

## 2017-09-08 DIAGNOSIS — Z853 Personal history of malignant neoplasm of breast: Secondary | ICD-10-CM | POA: Diagnosis not present

## 2017-09-08 DIAGNOSIS — N139 Obstructive and reflux uropathy, unspecified: Secondary | ICD-10-CM | POA: Diagnosis present

## 2017-09-08 DIAGNOSIS — Z888 Allergy status to other drugs, medicaments and biological substances status: Secondary | ICD-10-CM

## 2017-09-08 DIAGNOSIS — E872 Acidosis, unspecified: Secondary | ICD-10-CM

## 2017-09-08 DIAGNOSIS — R918 Other nonspecific abnormal finding of lung field: Secondary | ICD-10-CM | POA: Diagnosis not present

## 2017-09-08 DIAGNOSIS — N179 Acute kidney failure, unspecified: Secondary | ICD-10-CM | POA: Diagnosis present

## 2017-09-08 DIAGNOSIS — Z66 Do not resuscitate: Secondary | ICD-10-CM | POA: Diagnosis present

## 2017-09-08 DIAGNOSIS — Z885 Allergy status to narcotic agent status: Secondary | ICD-10-CM | POA: Diagnosis not present

## 2017-09-08 DIAGNOSIS — R34 Anuria and oliguria: Secondary | ICD-10-CM | POA: Diagnosis present

## 2017-09-08 DIAGNOSIS — T83022A Displacement of nephrostomy catheter, initial encounter: Secondary | ICD-10-CM

## 2017-09-08 DIAGNOSIS — R42 Dizziness and giddiness: Secondary | ICD-10-CM | POA: Diagnosis present

## 2017-09-08 DIAGNOSIS — N1330 Unspecified hydronephrosis: Secondary | ICD-10-CM | POA: Diagnosis present

## 2017-09-08 DIAGNOSIS — Z7189 Other specified counseling: Secondary | ICD-10-CM | POA: Diagnosis not present

## 2017-09-08 DIAGNOSIS — Z6821 Body mass index (BMI) 21.0-21.9, adult: Secondary | ICD-10-CM | POA: Diagnosis not present

## 2017-09-08 DIAGNOSIS — I672 Cerebral atherosclerosis: Secondary | ICD-10-CM | POA: Diagnosis not present

## 2017-09-08 LAB — BASIC METABOLIC PANEL
ANION GAP: 17 — AB (ref 5–15)
BUN: 91 mg/dL — AB (ref 6–20)
CALCIUM: 9.1 mg/dL (ref 8.9–10.3)
CO2: 19 mmol/L — ABNORMAL LOW (ref 22–32)
CREATININE: 5.55 mg/dL — AB (ref 0.44–1.00)
Chloride: 99 mmol/L — ABNORMAL LOW (ref 101–111)
GFR calc Af Amer: 8 mL/min — ABNORMAL LOW (ref >60)
GFR, EST NON AFRICAN AMERICAN: 7 mL/min — AB (ref >60)
GLUCOSE: 126 mg/dL — AB (ref 65–99)
Potassium: 4.7 mmol/L (ref 3.5–5.1)
Sodium: 135 mmol/L (ref 135–145)

## 2017-09-08 LAB — CBC
HCT: 33.2 % — ABNORMAL LOW (ref 35.0–47.0)
Hemoglobin: 11.1 g/dL — ABNORMAL LOW (ref 12.0–16.0)
MCH: 30.6 pg (ref 26.0–34.0)
MCHC: 33.3 g/dL (ref 32.0–36.0)
MCV: 91.9 fL (ref 80.0–100.0)
Platelets: 383 10*3/uL (ref 150–440)
RBC: 3.61 MIL/uL — ABNORMAL LOW (ref 3.80–5.20)
RDW: 14.5 % (ref 11.5–14.5)
WBC: 8.3 10*3/uL (ref 3.6–11.0)

## 2017-09-08 LAB — TSH: TSH: 1.416 u[IU]/mL (ref 0.350–4.500)

## 2017-09-08 MED ORDER — HEPARIN SODIUM (PORCINE) 5000 UNIT/ML IJ SOLN
5000.0000 [IU] | Freq: Three times a day (TID) | INTRAMUSCULAR | Status: DC
  Administered 2017-09-09 – 2017-09-12 (×7): 5000 [IU] via SUBCUTANEOUS
  Filled 2017-09-08 (×7): qty 1

## 2017-09-08 MED ORDER — PROCHLORPERAZINE MALEATE 10 MG PO TABS
10.0000 mg | ORAL_TABLET | Freq: Four times a day (QID) | ORAL | Status: DC | PRN
  Filled 2017-09-08: qty 1

## 2017-09-08 MED ORDER — ONDANSETRON HCL 4 MG PO TABS: 4.0000 mg | ORAL_TABLET | Freq: Four times a day (QID) | ORAL | Status: DC | PRN

## 2017-09-08 MED ORDER — MEMANTINE HCL 5 MG PO TABS
10.0000 mg | ORAL_TABLET | Freq: Two times a day (BID) | ORAL | Status: DC
  Administered 2017-09-09 – 2017-09-12 (×6): 10 mg via ORAL
  Filled 2017-09-08 (×6): qty 2

## 2017-09-08 MED ORDER — SODIUM CHLORIDE 0.9 % IV SOLN
INTRAVENOUS | Status: DC
  Administered 2017-09-08 – 2017-09-12 (×7): via INTRAVENOUS

## 2017-09-08 MED ORDER — B COMPLEX PO TABS: 1.0000 | ORAL_TABLET | Freq: Every day | ORAL | Status: DC

## 2017-09-08 MED ORDER — B COMPLEX-C PO TABS
1.0000 | ORAL_TABLET | Freq: Every day | ORAL | Status: DC
  Administered 2017-09-09 – 2017-09-12 (×4): 1 via ORAL
  Filled 2017-09-08 (×4): qty 1

## 2017-09-08 MED ORDER — MEGESTROL ACETATE 400 MG/10ML PO SUSP
400.0000 mg | Freq: Two times a day (BID) | ORAL | Status: DC
  Administered 2017-09-09 – 2017-09-10 (×2): 400 mg via ORAL
  Filled 2017-09-08 (×4): qty 10

## 2017-09-08 MED ORDER — HYDROCODONE-ACETAMINOPHEN 5-325 MG PO TABS
1.0000 | ORAL_TABLET | ORAL | Status: DC | PRN
  Administered 2017-09-09 – 2017-09-12 (×5): 1 via ORAL
  Filled 2017-09-08 (×6): qty 1

## 2017-09-08 MED ORDER — ONDANSETRON HCL 4 MG/2ML IJ SOLN: 4.0000 mg | Freq: Four times a day (QID) | INTRAMUSCULAR | Status: DC | PRN

## 2017-09-08 MED ORDER — DEXAMETHASONE 4 MG PO TABS: 8.0000 mg | ORAL_TABLET | ORAL | Status: DC

## 2017-09-08 MED ORDER — ACETAMINOPHEN 500 MG PO TABS: 1000.0000 mg | ORAL_TABLET | Freq: Four times a day (QID) | ORAL | Status: DC | PRN

## 2017-09-08 MED ORDER — SODIUM CHLORIDE 0.9 % IV BOLUS (SEPSIS)
1000.0000 mL | Freq: Once | INTRAVENOUS | Status: AC
  Administered 2017-09-08: 1000 mL via INTRAVENOUS

## 2017-09-08 MED ORDER — VITAMIN D 1000 UNITS PO TABS
2000.0000 [IU] | ORAL_TABLET | Freq: Every day | ORAL | Status: DC
  Administered 2017-09-09 – 2017-09-12 (×4): 2000 [IU] via ORAL
  Filled 2017-09-08 (×4): qty 2

## 2017-09-08 NOTE — H&P (Signed)
[facility] NAME: Jenna Parrish    MR#:  [id number]  DATE OF BIRTH:  [birth year]  DATE OF ADMISSION:  09/08/2017  PRIMARY CARE PHYSICIAN: Adin Hector, MD   REQUESTING/REFERRING PHYSICIAN: Merlyn Lot Md  CHIEF COMPLAINT:   Chief Complaint  Patient presents with  . Weakness  . Dizziness    HISTORY OF PRESENT ILLNESS: Jenna Parrish  is a 76 y.o. female with a known history of metastatic cervical cancer, Alzheimer's dementia, GERD, hydronephrosis who has had previous stents presenting to the ED with generalized weakness.  She is brought in by her husband due to progressive weakness patient not able to eat much.  She just feels like not eating.  And has not had much appetite.  Patient also has not been drinking much.  According to him she has been confused.  She has not had any fevers or chills.  She denies any chest pain or palpitations.  Patient in the emergency room was noted to have acute renal failure.  PAST MEDICAL HISTORY:   Past Medical History:  Diagnosis Date  . Abnormal Pap smear of cervix   . Alzheimer's dementia   . Breast cancer ([place])    left lumpectomy with node removal  . Cervical cancer ([place])   . 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 Dehydration 08/19/2017  . Dementia   . GERD (gastroesophageal reflux disease)   . Goals of care, counseling/discussion 08/19/2017  . Hydronephrosis   . Pelvic mass     PAST SURGICAL HISTORY:  Past Surgical History:  Procedure Laterality Date  . APPENDECTOMY    . BREAST LUMPECTOMY    . CERVICAL ABLATION N/A 08/12/2017   Procedure: CERVICAL BIOPSY;  Surgeon: Ouida Sills, Gwen Her, MD;  Location: [facility] ORS;  Service: Gynecology;  Laterality: N/A;  . CYSTOSCOPY W/ RETROGRADES Bilateral 08/12/2017   Procedure: CYSTOSCOPY WITH RETROGRADE PYELOGRAM;  Surgeon: [name], MD;  Location: [facility] ORS;  Service: Urology;  Laterality: Bilateral;  .  CYSTOSCOPY WITH STENT PLACEMENT Bilateral 08/12/2017   Procedure: CYSTOSCOPY WITH STENT PLACEMENT;  Surgeon: [name], MD;  Location: [facility] OR[facility];  Service: Urology;  Laterality: Bilateral;  . PORTA CATH INSERTION N/A 09/01/2017   Procedure: PORTA CATH INSERTION;  Surgeon: Katha Cabal, MD;  Location: [facility];  Service: Cardiovascular;  Laterality: N/A;  . TOTAL HIP ARTHROPLASTY Right     SOCIAL HISTORY:  Social History   Tobacco Use  . Smoking status: Never Smoker  . Smokeless tobacco: Never Used  Substance Use Topics  . Alcohol use: No    Frequency: Never    FAMILY HISTORY:  Family History  Problem Relation Age of Onset  . Alzheimer's disease Mother   . Heart failure Father   . Bladder Cancer Neg Hx   . Kidney cancer Neg Hx     DRUG ALLERGIES:  Allergies  Allergen Reactions  . Salicylates Hives  . Aricept [Donepezil Hcl]   . Aspirin Hives  . Codeine Other (See Comments)    Syncope   . Donepezil Other (See Comments)    Sedation  . Galantamine Other (See Comments)    Dizziness    REVIEW OF SYSTEMS:   CONSTITUTIONAL: No fever, positive fatigue positive weakness.  EYES: No blurred or double vision.  EARS, NOSE, AND THROAT: No tinnitus or ear pain.  RESPIRATORY: No cough, shortness of breath, wheezing or hemoptysis.  CARDIOVASCULAR: No chest pain, orthopnea, edema.  GASTROINTESTINAL:  No nausea, vomiting, diarrhea or abdominal pain.  GENITOURINARY: No dysuria, hematuria.  ENDOCRINE: No polyuria, nocturia,  HEMATOLOGY: No anemia, easy bruising or bleeding SKIN: No rash or lesion. MUSCULOSKELETAL: No joint pain or arthritis.   NEUROLOGIC: No tingling, numbness, weakness.  PSYCHIATRY: No anxiety or depression.   MEDICATIONS AT HOME:  Prior to Admission medications   Medication Sig Start Date End Date Taking? Authorizing Provider  acetaminophen (TYLENOL) 500 MG tablet Take 1,000 mg by mouth every 6 (six) hours as needed (for pain.).     [provider]  b complex vitamins tablet Take 1 tablet by mouth daily.    [provider]  Cholecalciferol (VITAMIN D) 2000 units tablet Take 2,000 Units by mouth daily.    [provider]  dexamethasone (DECADRON) 4 MG tablet Take 2 tablets (8 mg total) by mouth See admin instructions. Take 2 tablets (8 mg total) by mouth daily. Start the day after chemotherapy for 2 days 09/02/17   Earlie Server, MD  lidocaine-prilocaine (EMLA) cream Apply 1 application topically as needed. Apply small amount to port site at least 1 hour prior to it being accessed, cover with plastic wrap 08/28/17   Earlie Server, MD  memantine (NAMENDA) 10 MG tablet Take 10 mg by mouth 2 (two) times daily.  08/19/16 08/28/18  [provider]  ondansetron (ZOFRAN) 4 MG tablet Take 1 tablet (4 mg total) by mouth every 6 (six) hours as needed for nausea, vomiting or refractory nausea / vomiting. 09/02/17   Earlie Server, MD  prochlorperazine (COMPAZINE) 10 MG tablet Take 1 tablet (10 mg total) by mouth every 6 (six) hours as needed for nausea or vomiting. 09/02/17   Earlie Server, MD      PHYSICAL EXAMINATION:   VITAL SIGNS: Blood pressure (!) 156/73, pulse 88, temperature 98.2 F (36.8 C), temperature source Oral, resp. rate 16, height 5\' 5"  (1.651 m), weight 130 lb (59 kg), SpO2 100 %.  GENERAL:  76 y.o.-year-old patient lying in the bed with no acute distress.  EYES: Pupils equal, round, reactive to light and accommodation. No scleral icterus. Extraocular muscles intact.  HEENT: Head atraumatic, normocephalic. Oropharynx and nasopharynx clear.  NECK:  Supple, no jugular venous distention. No thyroid enlargement, no tenderness.  LUNGS: Normal breath sounds bilaterally, no wheezing, rales,rhonchi or crepitation. No use of accessory muscles of respiration.  CARDIOVASCULAR: S1, S2 normal. No murmurs, rubs, or gallops.  ABDOMEN: Soft, nontender, nondistended. Bowel sounds present. No organomegaly or mass.   EXTREMITIES: No pedal edema, cyanosis, or clubbing.  NEUROLOGIC: Cranial nerves II through XII are intact. Muscle strength 5/5 in all extremities. Sensation intact. Gait not checked.  PSYCHIATRIC: The patient is alert and oriented x 3.  SKIN: No obvious rash, lesion, or ulcer.   LABORATORY PANEL:   CBC Recent Labs  Lab 09/02/17 0905 09/08/17 1631  WBC 5.6 8.3  HGB 11.4* 11.1*  HCT 34.0* 33.2*  PLT 276 383  MCV 92.6 91.9  MCH 31.1 30.6  MCHC 33.6 33.3  RDW 14.3 14.5  LYMPHSABS 0.7*  --   MONOABS 0.4  --   EOSABS 0.1  --   BASOSABS 0.0  --    ------------------------------------------------------------------------------------------------------------------  Chemistries  Recent Labs  Lab 09/02/17 0905 09/08/17 1631  NA 138 135  K 3.6 4.7  CL 103 99*  CO2 24 19*  GLUCOSE 187* 126*  BUN 27* 91*  CREATININE 1.61* 5.55*  CALCIUM 9.1 9.1  AST 28  --   ALT 9*  --  ALKPHOS 63  --   BILITOT 0.6  --    ------------------------------------------------------------------------------------------------------------------ estimated creatinine clearance is 7.8 mL/min (A) (by C-G formula based on SCr of 5.55 mg/dL (H)). ------------------------------------------------------------------------------------------------------------------ No results for input(s): TSH, T4TOTAL, T3FREE, THYROIDAB in the last 72 hours.  Invalid input(s): FREET3   Coagulation profile No results for input(s): INR, PROTIME in the last 168 hours. ------------------------------------------------------------------------------------------------------------------- No results for input(s): DDIMER in the last 72 hours. -------------------------------------------------------------------------------------------------------------------  Cardiac Enzymes No results for input(s): CKMB, TROPONINI, MYOGLOBIN in the last 168 hours.  Invalid input(s):  CK ------------------------------------------------------------------------------------------------------------------ Invalid input(s): POCBNP  ---------------------------------------------------------------------------------------------------------------  Urinalysis    Component Value Date/Time   APPEARANCEUR Clear 08/10/2017 1401   GLUCOSEU Negative 08/10/2017 1401   BILIRUBINUR Negative 08/10/2017 1401   PROTEINUR Negative 08/10/2017 1401   NITRITE Negative 08/10/2017 1401   LEUKOCYTESUR 1+ (A) 08/10/2017 1401     RADIOLOGY: US Renal  Result Date: 09/08/2017 CLINICAL DATA:  Acute kidney injury. Status post stent. Evaluate hydronephrosis. EXAM: RENAL / URINARY TRACT ULTRASOUND COMPLETE COMPARISON:  PET-CT 08/27/2017 FINDINGS: Right Kidney: Length: 10 cm. Advanced hydronephrosis and hydroureter. Cortical thinning seen down to 8 mm. No evidence of mass or collection. Hydronephrosis and hydroureter is similar to prior PET-CT, new from renal ultrasound 08/17/2017. A ureteral stent is seen to the level of the renal pelvis. Left Kidney: Length: 11 cm. Moderate hydronephrosis. No evidence of mass or atrophy. A stent is seen to the level of the UPJ. Bladder: Physiologically distended. No inflammatory changes or debris noted. Bilateral ureteral stents. IMPRESSION: 1. Right greater than left hydronephrosis that is similar to 08/27/2017 PET-CT. 2. Bilateral ureteral stents in place. 3. Mild right renal cortical thinning. Electronically Signed   By: Monte Fantasia M.D.   On: 09/08/2017 19:00    EKG: Orders placed or performed during the hospital encounter of 09/08/17  . ED EKG  . ED EKG    IMPRESSION AND PLAN: Patient is a 76 year old white female with metastatic cervical cancer who is presenting with generalized weakness noted to have acute renal failure  1. acute renal failure could be related to dehydration, or obstruction of her previous stents At this point I will give her IV  fluids The ED physician has discussed the case with the urologist, who will evaluate her tomorrow to figure out if she needs to do a cystoscopy with reevaluation of the stent.  2.  Metastatic cervical cancer Ask oncology to see Also asked palliative care team to see Continue dexamethasone  3.  Poor appetite start patient on Megace  4.  Dementia committed continue Namenda  5.  Miscellaneous heparin for DVT prophylaxis    All the records are reviewed and case discussed with ED provider. Management plans discussed with the patient, family and they are in agreement.  CODE STATUS: Code Status History    Date Active Date Inactive Code Status Order ID Comments User Context   08/13/2017 13:31 08/14/2017 16:06 Full Code [id number]  Loletha Grayer, MD Inpatient    Advance Directive Documentation     Most Recent Value  Type of Advance Directive  Healthcare Power of [place], Living will  Pre-existing out of facility DNR order (yellow form or pink MOST form)  No data  "MOST" Form in Place?  No data       TOTAL TIME TAKING CARE OF THIS PATIENT:55 minutes.    Dustin Flock M.D on 09/08/2017 at 8:10 PM  Between 7am to 6pm - Pager - [phone]  After 6pm go to [email or web address] -  password [email or web address] [facility]  [facility]  Office  [phone]  CC: Primary care physician; Adin Hector, MD

## 2017-09-08 NOTE — Telephone Encounter (Signed)
Me Bhullar called reporting that he is not going to be avle to get patient here for appts tomorrow as she is very weak, dizzy when she sits up and has not been eating. He is unable to get her out of the bed. Advised to take to ER via EMS, but he was reluctant and asked about EMS bringing her here to her appointment tomorrow. I discussed with Dr Tasia Catchings who also advises to go to ER. When I discussed Dr Collie Siad advise he asked if she should just go with Hospice. I advised he take her to ER and ask to speak with a Palliative Care practitioner while here to help him decide the right path for her

## 2017-09-08 NOTE — ED Triage Notes (Signed)
Pt in via POV with complaints of dizziness, generalized weakness, decreased appetite x 1-2 months.  Pt with recent dx of cervical cancer, to begin chemo treatments tomorrow.  NAD noted at this time.

## 2017-09-08 NOTE — Progress Notes (Deleted)
Hematology/Oncology Consult note [facility] Telephone:([phone] [phone]   Patient Care Team: Adin Hector, MD as PCP - General (Internal Medicine) Clent Jacks, RN as Registered Nurse  REFERRING PROVIDER: Schermerhorn, Burman Blacksmith, MD  CHIEF COMPLAINTS/PURPOSE OF CONSULTATION:  Evaluation of Newly diagnosed cervical cancer.  HISTORY OF PRESENTING ILLNESS:  Jenna Parrish is a  76 y.o.  female with PMH listed below who was referred to me for evaluation of newly diagnosed cervical cancer. Patient has dementia. She was accompanied by her husband and daughter. She was seen by GYN oncologist Dr. Virgina Organ Patient started DUE to findings of a rising creatinine and bilateral hydronephrosis. CT scan 08/07/2017 showed dilated ureters terminated at expanded vaginal dome. Positive retroperitoneal adenopathy and 2 nodules in lungs. Patient underwent cervical biopsy cystoscopy and bilateral retrograde pyelogram. Status post bilateral urethral stent placement  Biopsy reviewed cervical cancer. Patient's daughter reports that patient doesn't drink much fluid. She feels dizzy. She was also found to be Orthostatic hypotensive in GYN oncology clinic. Husband reports that she also feels some nausea and is why she doesn't eat. Much. She was prescribed with a trial of meclizine to see if that will help with her dizziness. Patient has a remote history of breast cancer status post lumpectomy and radiation. This was in 2001 in [place] patient also received chemotherapy and husband remember patient had reported neuropathy after the chemotherapy details of the chemotherapy cannot be recalled.  INTERVAL HISTORY MARGAURITE Parrish is a 76 y.o. female who has above history reviewed by me today presents for follow up visit for management of cervical cancer. She had PET scan done during the interval which showed clinically metastatic disease.  Today patient is accompanied by her husband and two daughters. Patient denies any urinary symptoms or pain. Husband reports that patient goes to bathroom quite often. Denies fever or chills. Appetite is poor, does not eat much and feels fullness.  Lost 2 pounds since last visit.  She has appointment with Dr.Chrystal for simulation tomorrow.   Review of Systems  Constitutional: Positive for weight loss. Negative for chills and fever.  HENT: Negative.  Negative for hearing loss.   Eyes: Negative for blurred vision and double vision.  Respiratory: Negative for cough and hemoptysis.   Cardiovascular: Negative for chest pain and palpitations.  Gastrointestinal: Positive for nausea. Negative for abdominal pain and heartburn.  Genitourinary: Negative for dysuria.  Musculoskeletal: Negative for myalgias.  Skin: Negative for rash.  Neurological: Positive for tingling. Negative for dizziness and headaches.  Endo/Heme/Allergies: Negative for environmental allergies. Does not bruise/bleed easily.  Psychiatric/Behavioral: Negative for depression and suicidal ideas.    MEDICAL HISTORY:  Past Medical History:  Diagnosis Date  . Abnormal Pap smear of cervix   . Alzheimer's dementia   . Breast cancer ([place])    left lumpectomy with node removal  . Cervical cancer ([place])   . Cervical cancer, FIGO stage IVB ([place]) 09/01/2017  . Dehydration 08/19/2017  . Dementia   . GERD (gastroesophageal reflux disease)   . Goals of care, counseling/discussion 08/19/2017  . Hydronephrosis   . Pelvic mass     SURGICAL HISTORY: Past Surgical History:  Procedure Laterality Date  . APPENDECTOMY    . BREAST LUMPECTOMY    . CERVICAL ABLATION N/A 08/12/2017   Procedure: CERVICAL BIOPSY;  Surgeon: Ouida Sills, Gwen Her, MD;  Location: [facility] ORS;  Service: Gynecology;  Laterality: N/A;  . CYSTOSCOPY W/ RETROGRADES Bilateral 08/12/2017   Procedure:  CYSTOSCOPY WITH RETROGRADE PYELOGRAM;  Surgeon: [name], MD;   Location: [facility] OR[facility];  Service: Urology;  Laterality: Bilateral;  . CYSTOSCOPY WITH STENT PLACEMENT Bilateral 08/12/2017   Procedure: CYSTOSCOPY WITH STENT PLACEMENT;  Surgeon: [name], MD;  Location: [facility] OR[facility];  Service: Urology;  Laterality: Bilateral;  . PORTA CATH INSERTION N/A 09/01/2017   Procedure: PORTA CATH INSERTION;  Surgeon: Katha Cabal, MD;  Location: [place] [facility];  Service: Cardiovascular;  Laterality: N/A;  . TOTAL HIP ARTHROPLASTY Right     SOCIAL HISTORY: Social History   Socioeconomic History  . Marital status: Married    Spouse name: Not on file  . Number of children: Not on file  . Years of education: Not on file  . Highest education level: Not on file  Social Needs  . Financial resource strain: Not on file  . Food insecurity - worry: Not on file  . Food insecurity - inability: Not on file  . Transportation needs - medical: Not on file  . Transportation needs - non-medical: Not on file  Occupational History  . Not on file  Tobacco Use  . Smoking status: Never Smoker  . Smokeless tobacco: Never Used  Substance and Sexual Activity  . Alcohol use: No    Frequency: Never  . Drug use: No  . Sexual activity: Not on file  Other Topics Concern  . Not on file  Social History Narrative  . Not on file    FAMILY HISTORY: Family History  Problem Relation Age of Onset  . Alzheimer's disease Mother   . Heart failure Father   . Bladder Cancer Neg Hx   . Kidney cancer Neg Hx     ALLERGIES:  is allergic to salicylates; aricept [donepezil hcl]; aspirin; codeine; donepezil; and galantamine.  MEDICATIONS:  Current Outpatient Medications  Medication Sig Dispense Refill  . acetaminophen (TYLENOL) 500 MG tablet Take 1,000 mg by mouth every 6 (six) hours as needed (for pain.).    Marland Kitchen b complex vitamins tablet Take 1 tablet by mouth daily.    . Cholecalciferol (VITAMIN D) 2000 units tablet Take 2,000 Units by mouth daily.    Marland Kitchen dexamethasone  (DECADRON) 4 MG tablet Take 2 tablets (8 mg total) by mouth See admin instructions. Take 2 tablets (8 mg total) by mouth daily. Start the day after chemotherapy for 2 days 30 tablet 0  . lidocaine-prilocaine (EMLA) cream Apply 1 application topically as needed. Apply small amount to port site at least 1 hour prior to it being accessed, cover with plastic wrap 30 g 1  . memantine (NAMENDA) 10 MG tablet Take 10 mg by mouth 2 (two) times daily.     . ondansetron (ZOFRAN) 4 MG tablet Take 1 tablet (4 mg total) by mouth every 6 (six) hours as needed for nausea, vomiting or refractory nausea / vomiting. 120 tablet 2  . prochlorperazine (COMPAZINE) 10 MG tablet Take 1 tablet (10 mg total) by mouth every 6 (six) hours as needed for nausea or vomiting. 30 tablet 0   No current facility-administered medications for this visit.      PHYSICAL EXAMINATION: ECOG PERFORMANCE STATUS: 3 - Symptomatic, >50% confined to bed There were no vitals filed for this visit. There were no vitals filed for this visit. Physical Exam  Constitutional: No distress.  HENT:  Head: Normocephalic and atraumatic.  Eyes: Conjunctivae and EOM are normal. No scleral icterus.  Neck: Normal range of motion. Neck supple.  Cardiovascular: Normal rate and regular  rhythm. Exam reveals no friction rub.  No murmur heard. Pulmonary/Chest: Effort normal and breath sounds normal. No respiratory distress.  Abdominal: Soft. Bowel sounds are normal. There is no tenderness.  Musculoskeletal: Normal range of motion. She exhibits no tenderness.  Lymphadenopathy:    She has cervical adenopathy.  Neurological: She is alert. No cranial nerve deficit.  left foot sensory intact. Right big toe sensory decreased. rest are normal.   Skin: Skin is warm and dry.  Psychiatric: Affect normal.   LABORATORY DATA:  I have reviewed the data as listed Lab Results  Component Value Date   WBC 5.6 09/02/2017   HGB 11.4 (L) 09/02/2017   HCT 34.0 (L)  09/02/2017   MCV 92.6 09/02/2017   PLT 276 09/02/2017   Recent Labs    08/14/17 0534 08/17/17 1154 09/02/17 0905  NA 143 139 138  K 3.5 3.8 3.6  CL 112* 103 103  CO2 23 22 24   GLUCOSE 88 104* 187*  BUN 32* 19 27*  CREATININE 3.01* 1.84* 1.61*  CALCIUM 7.7* 9.1 9.1  GFRNONAA 14* 26* 30*  GFRAA 16* 30* 35*  PROT  --   --  7.4  ALBUMIN  --   --  4.1  AST  --   --  28  ALT  --   --  9*  ALKPHOS  --   --  63  BILITOT  --   --  0.6    CT abdomen pelvis scan 08/07/2017 IMPRESSION: 1. Bilateral hydronephrosis identified right greater than left. Dilated ureters extend to the level of the pelvis. No obstructing stone identified. Suspicious for distal ureteral obstruction secondary to pelvic mass. 2. Enlargement of the vaginal dome and cervical area. Suspicious for underlying malignancy. Correlation with physical exam findings, pelvic ultrasound and/or contrast enhanced CT of the pelvis is advised. 3. Enlarged retroperitoneal lymph nodes. Suspicious for metastatic adenopathy. 4. There are 2 nonspecific pulmonary nodules identified within the right lower lobe which measure up to 1 cm. Non-contrast chest CT at 3-6 months is recommended. If the nodules are stable at time of repeat CT, then future CT at 18-24 months (from today's scan) is considered optional for low-risk patients, but is recommended for high-risk patients. This recommendation follows the consensus statement: Guidelines for Management of Incidental Pulmonary Nodules Detected on CT Images: From the Fleischner Society 2017; Radiology 2017; 284:228-243.    ASSESSMENT & PLAN:  76 yo female with history of dementia presents for management of clinically metastatic FIGO stage IVB cervical cancer.  1. Malignant neoplasm of cervix, unspecified site ([place])   2. Cervical cancer, FIGO stage IVB ([place])   3. Renal insufficiency   4. Dehydration   5. Encounter for antineoplastic chemotherapy     #  PET scan results were  discussed with patient and her family. She has clinically FIGO stage IVB cervical cancer disease.  Biopsy of distant metastasis for confirmation is desired, however, considering her symptomatic disease needing palliative treatment and knowing additional information does not change her treatment plan (cocurrent RT with carboplatin AUC 2)  at this point .will hold the biopsy.   # Discussed with Dr.Secord who agrees with the above plan. Discussed with Dr.Chrystal and patient will get simulation tomorrow and RT likely to start next week, plan carboplatin weekly AUC 2 started in 1 week.   # Send foundation one test and PD-L1.   I explained to the patient the risks and benefits of chemotherapy including all but not limited to hair loss,  mouth sore, nausea, vomiting, low blood counts, bleeding, and risk of life threatening infection and even death, secondary malignancy etc.   # Antiemetics-Zofran and Compazine; EMLA cream sent to pharmacy   # medical records from her previous oncologist in [place] reviewed. She had T1N1 Stage II ER positive left breast cancer ([facility]) diagnosed in 2001. She underwent lumpectomy followed by chemotherapy and radiation to lumpectomy site. Chemotherapy includes ACx 4, taxol x 4, completed February 2002, She finished her 5 years adjuvant endocrine treatment with Tamoxifen in 2007.   All questions were answered. The patient knows to call the clinic with any problems questions or concerns.  Return of visit:  1 week to start weekly carboplatin.  Total face to face encounter time for this patient visit was 40  min. >50% of the time was  spent in counseling and coordination of care.  Thank you for this kind referral and the opportunity to participate in the care of this patient. A copy of today's note is routed to referring provider    Earlie Server, MD, PhD Hematology Oncology [facility] Pager- [phone]

## 2017-09-08 NOTE — Consult Note (Addendum)
Consult: ARF, R>L hydronephrosis  Requested by: Dr. 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 She was taken to the operating room on 08/12/2017 for bilateral ureteral stent placement as well as cervical biopsy (+ for poorly differentiated carcinoma).   Of note, stent placement was technically difficult due to high-grade ureteral obstruction, 4.8 French placed on the right and a 6 [place] [employer] on the left.  At the time, her creatinine was around 4.05. Her Cr declined to 1.61 with resolution of hydronephrosis on 08/17/2017 [place]. PET CT 08/27/2017 c/w metastatic cervical cancer and return of R > L hydronephrosis although stents were in good position.   Pt presented to ED this evening with weakness, confusion and dizziness with Cr up to 5.55 with a BUN of 81. WBC 8.3. UA pending. US revealed R>L hydronephrosis wuth stent visualized in kidneys (stents appear to remain in good position).    Past Medical History:  Diagnosis Date  . Abnormal Pap smear of cervix   . Alzheimer's dementia   . Breast cancer ([place])    left lumpectomy with node removal  . Cervical cancer ([place])   . Cervical cancer, FIGO stage IVB ([place]) 09/01/2017  . Dehydration 08/19/2017  . Dementia   . GERD (gastroesophageal reflux disease)   . Goals of care, counseling/discussion 08/19/2017  . Hydronephrosis   . Pelvic mass    Past Surgical History:  Procedure Laterality Date  . APPENDECTOMY    . BREAST LUMPECTOMY    . CERVICAL ABLATION N/A 08/12/2017   Procedure: CERVICAL BIOPSY;  Surgeon: Ouida Sills, Gwen Her, MD;  Location: [facility] OR[facility];  Service: Gynecology;  Laterality: N/A;  . CYSTOSCOPY W/ RETROGRADES Bilateral 08/12/2017   Procedure: CYSTOSCOPY WITH RETROGRADE PYELOGRAM;  Surgeon: [name], MD;  Location: [facility] OR[facility];  Service: Urology;  Laterality: Bilateral;  . CYSTOSCOPY WITH STENT PLACEMENT Bilateral 08/12/2017   Procedure: CYSTOSCOPY WITH STENT PLACEMENT;  Surgeon: [name], MD;  Location: [facility];  Service: Urology;  Laterality: Bilateral;  . PORTA CATH INSERTION N/A 09/01/2017   Procedure: PORTA CATH INSERTION;  Surgeon: Katha Cabal, MD;  Location: [place] [facility];  Service: Cardiovascular;  Laterality: N/A;  . TOTAL HIP ARTHROPLASTY Right     Home Medications:   (Not in a hospital admission) Allergies:  Allergies  Allergen Reactions  . Salicylates Hives  . Aricept [Donepezil Hcl]   . Aspirin Hives  . Codeine Other (See Comments)    Syncope   . Donepezil Other (See Comments)    Sedation  . Galantamine Other (See Comments)    Dizziness    Family History  Problem Relation Age of Onset  . Alzheimer's disease Mother   . Heart failure Father   . Bladder Cancer Neg Hx   . Kidney cancer Neg Hx    Social History:  reports that  has never smoked. she has never used smokeless tobacco. She reports that she does not drink alcohol or use drugs.  ROS: A complete review of systems was performed.  All systems are negative except for pertinent findings as noted. Review of Systems  Psychiatric/Behavioral: Positive for depression.  All other systems reviewed and are negative.    Physical Exam:  Vital signs in last 24 hours: Temp:  [98.2 F (36.8 C)] 98.2 F (36.8 C) (12/11 1627) Pulse Rate:  [87-102] 88 (12/11 1850) Resp:  [16] 16 (12/11 1627) BP: (121-156)/(70-73) 156/73 (12/11 1800) SpO2:  [99 %-100 %]  100 % (12/11 1850) Weight:  [59 kg (130 lb)] 59 kg (130 lb) (12/11 1630) General:  Alert but confused, no acute distress HEENT: Normocephalic, atraumatic Cardiovascular: Regular rate and rhythm Lungs: Regular rate and effort Abdomen: Soft, nontender, nondistended, no abdominal masses Back: No CVA tenderness Extremities: No edema Neurologic: Grossly intact  Nurse Paulette and Lorrie had trouble putting in a foley. They chaperoned and assisted me. GU: atrophic  introitus, meatus appeared normal  Pt prepped and 16Fr foley placed without difficulty. Clear urine returned but only about 100 ml. Balloon palpable at bladder neck in appropriate position.   Laboratory Data:  Results for orders placed or performed during the hospital encounter of 09/08/17 (from the past 24 hour(s))  Basic metabolic panel     Status: Abnormal   Collection Time: 09/08/17  4:31 PM  Result Value Ref Range   Sodium 135 135 - 145 mmol/L   Potassium 4.7 3.5 - 5.1 mmol/L   Chloride 99 (L) 101 - 111 mmol/L   CO2 19 (L) 22 - 32 mmol/L   Glucose, Bld 126 (H) 65 - 99 mg/dL   BUN 91 (H) 6 - 20 mg/dL   Creatinine, Ser 5.55 (H) 0.44 - 1.00 mg/dL   Calcium 9.1 8.9 - 10.3 mg/dL   GFR calc non Af Amer 7 (L) >60 mL/min   GFR calc Af Amer 8 (L) >60 mL/min   Anion gap 17 (H) 5 - 15  CBC     Status: Abnormal   Collection Time: 09/08/17  4:31 PM  Result Value Ref Range   WBC 8.3 3.6 - 11.0 K/uL   RBC 3.61 (L) 3.80 - 5.20 MIL/uL   Hemoglobin 11.1 (L) 12.0 - 16.0 g/dL   HCT 33.2 (L) 35.0 - 47.0 %   MCV 91.9 80.0 - 100.0 fL   MCH 30.6 26.0 - 34.0 pg   MCHC 33.3 32.0 - 36.0 g/dL   RDW 14.5 11.5 - 14.5 %   Platelets 383 150 - 440 K/uL   No results found for this or any previous visit (from the past 240 hour(s)). Creatinine: Recent Labs    09/02/17 0905 09/08/17 1631  CREATININE 1.61* 5.55*   I reviewed CT, [place], PET/CT and [place] images independently.   Impression/Assessment:  -metastatic cervical cancer - pt was to begin chemo tomorrow  -bilateral hydro, R>L with acute on chronic renal failure - hydrate, maintain foley to prevent reflux and monitor UOP with hydration. Consider stent exchange (upsize right to 6Fr) vs perc tubes if no improvement.  I made NPO in the event she needs something done tomorrow.   Festus Aloe 09/08/2017, 9:42 PM

## 2017-09-08 NOTE — ED Provider Notes (Signed)
[facility] Emergency Department Provider Note    First MD Initiated Contact with Patient 09/08/17 1721     (approximate)  I have reviewed the triage vital signs and the nursing notes.   HISTORY  Chief Complaint Weakness and Dizziness    HPI Jenna Parrish is a 76 y.o. female with a history of dementia as well as stage IVb cervical cancer scheduled to start chemotherapy tomorrow presents with 2 weeks of worsening fatigue, confusion and decreased oral intake.  Husband is at bedside and states that she has been more confused and sleepy.  States that she sleeps most of the day.  Does not have much to eat or drink.  Patient also has a history of obstructive uropathy status post stent placement.  She denies any pain.  Did have brief episode of nausea and vomiting this morning that has resolved.  No fevers.  No chest pain or shortness of breath.  Denies any urinary symptoms.  States that she is still making urine.  Past Medical History:  Diagnosis Date  . Abnormal Pap smear of cervix   . Alzheimer's dementia   . Breast cancer ([place])    left lumpectomy with node removal  . Cervical cancer ([place])   . Cervical cancer, FIGO stage IVB ([place]) 09/01/2017  . Dehydration 08/19/2017  . Dementia   . GERD (gastroesophageal reflux disease)   . Goals of care, counseling/discussion 08/19/2017  . Hydronephrosis   . Pelvic mass    Family History  Problem Relation Age of Onset  . Alzheimer's disease Mother   . Heart failure Father   . Bladder Cancer Neg Hx   . Kidney cancer Neg Hx    Past Surgical History:  Procedure Laterality Date  . APPENDECTOMY    . BREAST LUMPECTOMY    . CERVICAL ABLATION N/A 08/12/2017   Procedure: CERVICAL BIOPSY;  Surgeon: Ouida Sills, Gwen Her, MD;  Location: [facility] ORS;  Service: Gynecology;  Laterality: N/A;  . CYSTOSCOPY W/ RETROGRADES Bilateral 08/12/2017   Procedure: CYSTOSCOPY WITH RETROGRADE PYELOGRAM;  Surgeon: [name], MD;   Location: [facility] ORS;  Service: Urology;  Laterality: Bilateral;  . CYSTOSCOPY WITH STENT PLACEMENT Bilateral 08/12/2017   Procedure: CYSTOSCOPY WITH STENT PLACEMENT;  Surgeon: [name], MD;  Location: [facility] ORS;  Service: Urology;  Laterality: Bilateral;  . PORTA CATH INSERTION N/A 09/01/2017   Procedure: PORTA CATH INSERTION;  Surgeon: Katha Cabal, MD;  Location: [place] [facility];  Service: Cardiovascular;  Laterality: N/A;  . TOTAL HIP ARTHROPLASTY Right    Patient Active Problem List   Diagnosis Date Noted  . Acute renal failure (ARF) ([place]) 09/08/2017  . Cervical cancer, FIGO stage IVB ([place]) 09/01/2017  . Dehydration 08/19/2017  . Goals of care, counseling/discussion 08/19/2017  . Acute kidney injury ([place]) 08/13/2017      Prior to Admission medications   Medication Sig Start Date End Date Taking? Authorizing Provider  acetaminophen (TYLENOL) 500 MG tablet Take 1,000 mg by mouth every 6 (six) hours as needed (for pain.).    [provider]  b complex vitamins tablet Take 1 tablet by mouth daily.    [provider]  Cholecalciferol (VITAMIN D) 2000 units tablet Take 2,000 Units by mouth daily.    [provider]  dexamethasone (DECADRON) 4 MG tablet Take 2 tablets (8 mg total) by mouth See admin instructions. Take 2 tablets (8 mg total) by mouth daily. Start the day after chemotherapy for 2 days 09/02/17  Earlie Server, MD  lidocaine-prilocaine (EMLA) cream Apply 1 application topically as needed. Apply small amount to port site at least 1 hour prior to it being accessed, cover with plastic wrap 08/28/17   Earlie Server, MD  memantine (NAMENDA) 10 MG tablet Take 10 mg by mouth 2 (two) times daily.  08/19/16 08/28/18  [provider]  ondansetron (ZOFRAN) 4 MG tablet Take 1 tablet (4 mg total) by mouth every 6 (six) hours as needed for nausea, vomiting or refractory nausea / vomiting. 09/02/17   Earlie Server, MD  prochlorperazine (COMPAZINE) 10 MG tablet  Take 1 tablet (10 mg total) by mouth every 6 (six) hours as needed for nausea or vomiting. 09/02/17   Earlie Server, MD    Allergies Salicylates; Aricept Reather [place] hcl]; Aspirin; Codeine; Donepezil; and Galantamine    Social History Social History   Tobacco Use  . Smoking status: Never Smoker  . Smokeless tobacco: Never Used  Substance Use Topics  . Alcohol use: No    Frequency: Never  . Drug use: No    Review of Systems Patient denies headaches, rhinorrhea, blurry vision, numbness, shortness of breath, chest pain, edema, cough, abdominal pain, nausea, vomiting, diarrhea, dysuria, fevers, rashes or hallucinations unless otherwise stated above in HPI. ____________________________________________   PHYSICAL EXAM:  VITAL SIGNS: Vitals:   09/08/17 1849 09/08/17 1850  BP:    Pulse: 88 88  Resp:    Temp:    SpO2: 100% 100%    Constitutional: Alert, fatigued appearing and in no acute distress. Eyes: Conjunctivae are normal.  Head: Atraumatic. Nose: No congestion/rhinnorhea. Mouth/Throat: Mucous membranes are moist.   Neck: No stridor. Painless ROM.  Cardiovascular: Normal rate, regular rhythm. Grossly normal heart sounds.  Good peripheral circulation. Respiratory: Normal respiratory effort.  No retractions. Lungs CTAB. Gastrointestinal: Soft and nontender. No distention. No abdominal bruits. No CVA tenderness. Musculoskeletal: No lower extremity tenderness nor edema.  No joint effusions. Neurologic:  Normal speech and language. No gross focal neurologic deficits are appreciated. No facial droop Skin:  Skin is warm, dry and intact. No rash noted. Psychiatric: Mood and affect are normal.  ____________________________________________   LABS (all labs ordered are listed, but only abnormal results are displayed)  Results for orders placed or performed during the hospital encounter of 09/08/17 (from the past 24 hour(s))  Basic metabolic panel     Status: Abnormal   Collection  Time: 09/08/17  4:31 PM  Result Value Ref Range   Sodium 135 135 - 145 mmol/L   Potassium 4.7 3.5 - 5.1 mmol/L   Chloride 99 (L) 101 - 111 mmol/L   CO2 19 (L) 22 - 32 mmol/L   Glucose, Bld 126 (H) 65 - 99 mg/dL   BUN 91 (H) 6 - 20 mg/dL   Creatinine, Ser 5.55 (H) 0.44 - 1.00 mg/dL   Calcium 9.1 8.9 - 10.3 mg/dL   GFR calc non Af Amer 7 (L) >60 mL/min   GFR calc Af Amer 8 (L) >60 mL/min   Anion gap 17 (H) 5 - 15  CBC     Status: Abnormal   Collection Time: 09/08/17  4:31 PM  Result Value Ref Range   WBC 8.3 3.6 - 11.0 K/uL   RBC 3.61 (L) 3.80 - 5.20 MIL/uL   Hemoglobin 11.1 (L) 12.0 - 16.0 g/dL   HCT 33.2 (L) 35.0 - 47.0 %   MCV 91.9 80.0 - 100.0 fL   MCH 30.6 26.0 - 34.0 pg   MCHC 33.3 32.0 -  36.0 g/dL   RDW 14.5 11.5 - 14.5 %   Platelets 383 150 - 440 K/uL   ____________________________________________  EKG My review and personal interpretation at Time: 16:34   Indication: weakness  Rate: 100  Rhythm: sinus Axis: normal Other: normal intervals ____________________________________________  RADIOLOGY  I personally reviewed all radiographic images ordered to evaluate for the above acute complaints and reviewed radiology reports and findings.  These findings were personally discussed with the patient.  Please see medical record for radiology report.  ____________________________________________   PROCEDURES  Procedure(s) performed:  Procedures    Critical Care performed:  ____________________________________________   INITIAL IMPRESSION / ASSESSMENT AND PLAN / ED COURSE  Pertinent labs & imaging results that were available during my care of the patient were reviewed by me and considered in my medical decision making (see chart for details).  DDX: Dehydration, sepsis, pna, uti, hypoglycemia, aki, electrolyte abn   MORA PEDRAZA is a 76 y.o. who presents to the ED with as described above.  Blood work is concerning for acute on chronic renal failure with  uremic encephalopathy and metabolic acidosis.  Not clinically consistent with sepsis that she has no fever and no white count.  Her abdominal exam is otherwise soft and benign.  Given her history of obstructive uropathy will repeat ultrasound.  Will provide IV fluids.  Clinical Course as of Sep 09 2039  Tue Sep 08, 2017  1910 Ultrasound does show bilateral hydronephrosis which is consistent with prior to stent placement.  Will touch base with urology.  [PR]    Clinical Course User Index [PR] Merlyn Lot, MD   With Dr. Junious Silk of urology who agrees to consult on patient for evaluation of stent replacement versus percutaneous nephrostomy tube.  Patient will be admitted to the hospital for further medical management.  ____________________________________________   FINAL CLINICAL IMPRESSION(S) / ED DIAGNOSES  Final diagnoses:  Acute renal failure, unspecified acute renal failure type ([place])  Metabolic acidosis  Hydronephrosis, unspecified hydronephrosis type      NEW MEDICATIONS STARTED DURING THIS VISIT:  This SmartLink is deprecated. Use AVSMEDLIST instead to display the medication list for a patient.   Note:  This document was prepared using Dragon voice recognition software and may include unintentional dictation errors.    Merlyn Lot, MD 09/08/17 2042

## 2017-09-09 ENCOUNTER — Ambulatory Visit: Payer: Medicare Other

## 2017-09-09 ENCOUNTER — Inpatient Hospital Stay: Payer: Medicare Other

## 2017-09-09 ENCOUNTER — Inpatient Hospital Stay: Payer: Medicare Other | Admitting: Oncology

## 2017-09-09 DIAGNOSIS — K219 Gastro-esophageal reflux disease without esophagitis: Secondary | ICD-10-CM

## 2017-09-09 DIAGNOSIS — Z9221 Personal history of antineoplastic chemotherapy: Secondary | ICD-10-CM

## 2017-09-09 DIAGNOSIS — F0281 Dementia in other diseases classified elsewhere with behavioral disturbance: Secondary | ICD-10-CM

## 2017-09-09 DIAGNOSIS — Z923 Personal history of irradiation: Secondary | ICD-10-CM

## 2017-09-09 DIAGNOSIS — R531 Weakness: Secondary | ICD-10-CM

## 2017-09-09 DIAGNOSIS — I672 Cerebral atherosclerosis: Secondary | ICD-10-CM

## 2017-09-09 DIAGNOSIS — Z79899 Other long term (current) drug therapy: Secondary | ICD-10-CM

## 2017-09-09 DIAGNOSIS — R918 Other nonspecific abnormal finding of lung field: Secondary | ICD-10-CM

## 2017-09-09 DIAGNOSIS — C539 Malignant neoplasm of cervix uteri, unspecified: Secondary | ICD-10-CM

## 2017-09-09 DIAGNOSIS — I700 Atherosclerosis of aorta: Secondary | ICD-10-CM

## 2017-09-09 DIAGNOSIS — Z96641 Presence of right artificial hip joint: Secondary | ICD-10-CM

## 2017-09-09 DIAGNOSIS — N179 Acute kidney failure, unspecified: Principal | ICD-10-CM

## 2017-09-09 DIAGNOSIS — Z853 Personal history of malignant neoplasm of breast: Secondary | ICD-10-CM

## 2017-09-09 DIAGNOSIS — N1330 Unspecified hydronephrosis: Secondary | ICD-10-CM

## 2017-09-09 LAB — BASIC METABOLIC PANEL
Anion gap: 14 (ref 5–15)
BUN: 90 mg/dL — AB (ref 6–20)
CHLORIDE: 106 mmol/L (ref 101–111)
CO2: 17 mmol/L — ABNORMAL LOW (ref 22–32)
Calcium: 8.5 mg/dL — ABNORMAL LOW (ref 8.9–10.3)
Creatinine, Ser: 5.53 mg/dL — ABNORMAL HIGH (ref 0.44–1.00)
GFR calc Af Amer: 8 mL/min — ABNORMAL LOW (ref >60)
GFR calc non Af Amer: 7 mL/min — ABNORMAL LOW (ref >60)
Glucose, Bld: 95 mg/dL (ref 65–99)
POTASSIUM: 4.4 mmol/L (ref 3.5–5.1)
SODIUM: 137 mmol/L (ref 135–145)

## 2017-09-09 LAB — URINALYSIS, COMPLETE (UACMP) WITH MICROSCOPIC
Bilirubin Urine: NEGATIVE
Glucose, UA: NEGATIVE mg/dL
Ketones, ur: 5 mg/dL — AB
Nitrite: NEGATIVE
Protein, ur: 100 mg/dL — AB
SPECIFIC GRAVITY, URINE: 1.012 (ref 1.005–1.030)
pH: 5 (ref 5.0–8.0)

## 2017-09-09 LAB — CBC
HCT: 28.9 % — ABNORMAL LOW (ref 35.0–47.0)
HEMOGLOBIN: 9.7 g/dL — AB (ref 12.0–16.0)
MCH: 31 pg (ref 26.0–34.0)
MCHC: 33.6 g/dL (ref 32.0–36.0)
MCV: 92.2 fL (ref 80.0–100.0)
Platelets: 318 10*3/uL (ref 150–440)
RBC: 3.14 MIL/uL — AB (ref 3.80–5.20)
RDW: 14.6 % — ABNORMAL HIGH (ref 11.5–14.5)
WBC: 6.5 10*3/uL (ref 3.6–11.0)

## 2017-09-09 LAB — APTT: APTT: 35 s (ref 24–36)

## 2017-09-09 MED ORDER — LIDOCAINE 5 % EX OINT
TOPICAL_OINTMENT | Freq: Once | CUTANEOUS | Status: AC
  Administered 2017-09-09: 16:00:00 via TOPICAL
  Filled 2017-09-09: qty 35.44

## 2017-09-09 MED ORDER — ENSURE ENLIVE PO LIQD
237.0000 mL | Freq: Three times a day (TID) | ORAL | Status: DC
  Administered 2017-09-11 – 2017-09-12 (×2): 237 mL via ORAL

## 2017-09-09 MED ORDER — DEXTROSE 5 % IV SOLN
2.0000 g | INTRAVENOUS | Status: DC
  Administered 2017-09-09 – 2017-09-11 (×3): 2 g via INTRAVENOUS
  Filled 2017-09-09 (×3): qty 2

## 2017-09-09 NOTE — Progress Notes (Signed)
Patient and family agree to have bilateral percutaneous nephrostomy tubes tomorrow. Dr. Erlene Quan aware of proceeding with intervention.

## 2017-09-09 NOTE — Progress Notes (Signed)
Pharmacy Antibiotic Note  Jenna Parrish is a 76 y.o. female admitted on 09/08/2017 with UTI.  Pharmacy has been consulted for ceftraxone dosing.  Plan: Ceftriaxone 2 grams q 24 hours ordered  Height: 5\' 5"  (165.1 cm) Weight: 129 lb 2 oz (58.6 kg) IBW/kg (Calculated) : 57  Temp (24hrs), Avg:98 F (36.7 C), Min:97.7 F (36.5 C), Max:98.2 F (36.8 C)  Recent Labs  Lab 09/02/17 0905 09/08/17 1631  WBC 5.6 8.3  CREATININE 1.61* 5.55*    Estimated Creatinine Clearance: 7.8 mL/min (A) (by C-G formula based on SCr of 5.55 mg/dL (H)).    Allergies  Allergen Reactions  . Salicylates Hives  . Aricept [Donepezil Hcl]   . Aspirin Hives  . Codeine Other (See Comments)    Syncope   . Donepezil Other (See Comments)    Sedation  . Galantamine Other (See Comments)    Dizziness    Antimicrobials this admission: Ceftriaxone  12/12 >>    >>   Dose adjustments this admission:   Microbiology results: No recent micro      12/12 UA: LE(+) NO2(-)  WBC TNTC  Thank you for allowing pharmacy to be a part of this patient's care.  Kallum Jorgensen S 09/09/2017 4:36 AM

## 2017-09-09 NOTE — Progress Notes (Signed)
Initial Nutrition Assessment  DOCUMENTATION CODES:   Severe malnutrition in context of acute illness/injury  INTERVENTION:  Provide Ensure Enlive po TID, each supplement provides 350 kcal and 20 grams of protein.  NUTRITION DIAGNOSIS:   Severe Malnutrition related to acute illness(Stg IV cervical cancer) as evidenced by 7.7% weight loss over 1 month, moderate fat depletion, moderate muscle depletion, energy intake < or equal to 50% for > or equal to 5 days.  GOAL:   Patient will meet greater than or equal to 90% of their needs  MONITOR:   PO intake, Supplement acceptance, Labs, Weight trends, I & O's  REASON FOR ASSESSMENT:   Malnutrition Screening Tool    ASSESSMENT:   76 year old female with PMHx of dementia, GERD, breast cancer s/p left lumpectomy, recently diagnosed stage IVB cervical cancer with bilateral ureteral obstruction s/p ureteral stent placement admitted with acute renal failure, reucurrence of hydronephrosis.   -Plan is for placement of bilateral percutaneous nephrostomy tubes tomorrow.  Met with patient and her family members at bedside. Patient's husband provides most of history. He reports patient has had a decreased appetite for 2-3 weeks now. She is reporting nausea and feeling full all the time. She is just eating bites at meals. They report her last good meal was probably Thanksgiving.   Family reports her UBW was 140 lbs. Per chart she was 139.8 lbs on 08/11/2017. She has lost 10.7 lbs (7.7% body weight) over the past month, which is significant for time frame.  Medications reviewed and include: B-complex with vitamin C 1 tablet daily, vitamin D 2000 units daily, Megace 400 mg BID.  Labs reviewed: CO2 17, BUN 90, Creatinine 5.53.  Discussed with RN.  NUTRITION - FOCUSED PHYSICAL EXAM:    Most Recent Value  Orbital Region  Moderate depletion  Upper Arm Region  Severe depletion  Thoracic and Lumbar Region  Moderate depletion  Buccal Region   Moderate depletion  Temple Region  Moderate depletion  Clavicle Bone Region  Moderate depletion  Clavicle and Acromion Bone Region  Moderate depletion  Scapular Bone Region  Moderate depletion  Dorsal Hand  Moderate depletion  Patellar Region  Moderate depletion  Anterior Thigh Region  Moderate depletion  Posterior Calf Region  Mild depletion  Edema (RD Assessment)  None  Hair  Reviewed  Eyes  Reviewed  Mouth  Reviewed  Skin  Reviewed  Nails  Reviewed     Diet Order:  Diet regular Room service appropriate? Yes; Fluid consistency: Thin Diet NPO time specified  EDUCATION NEEDS:   No education needs have been identified at this time  Skin:  Skin Assessment: Skin Integrity Issues: Skin Integrity Issues:: Incisions Incisions: closed incision right neck  Last BM:  09/08/2017  Height:   Ht Readings from Last 1 Encounters:  09/08/17 _0  (1.651 m)    Weight:   Wt Readings from Last 1 Encounters:  09/08/17 129 lb 2 oz (58.6 kg)    Ideal Body Weight:  56.8 kg  BMI:  Body mass index is 21.49 kg/m.  Estimated Nutritional Needs:   Kcal:  1465-1760 (25-30 kcal/kg)  Protein:  70-88 grams (1.2-1.5 grams/kg)  Fluid:  1.5 L/day (25 mL/kg)  Willey Blade, MS, RD, LDN Office: [phone] Pager: [phone] After Hours/Weekend Pager: [phone]

## 2017-09-09 NOTE — Progress Notes (Signed)
Urology Consult Follow Up  Subjective: Patient's husband and daughter were at the bedside this morning.  Despite Foley catheter and IV resuscitation, she is only made 200 cc of urine over the past 12+ hours.  Creatinine remains markedly elevated.  Palliative care and oncology consults pending.  Anti-infectives: Anti-infectives (From admission, onward)   Start     Dose/Rate Route Frequency Ordered Stop   09/09/17 0600  cefTRIAXone (ROCEPHIN) 2 g in dextrose 5 % 50 mL IVPB     2 g 100 mL/hr over 30 Minutes Intravenous Every 24 hours 09/09/17 0436        Current Facility-Administered Medications  Medication Dose Route Frequency Provider Last Rate Last Dose  . 0.9 %  sodium chloride infusion   Intravenous Continuous Dustin Flock, MD 100 mL/hr at 09/08/17 2333    . acetaminophen (TYLENOL) tablet 1,000 mg  1,000 mg Oral Q6H PRN Dustin Flock, MD      . B-complex with vitamin C tablet 1 tablet  1 tablet Oral Daily Dustin Flock, MD      . cefTRIAXone (ROCEPHIN) 2 g in dextrose 5 % 50 mL IVPB  2 g Intravenous Q24H Saundra Shelling, MD   Stopped at 09/09/17 [phone]  . cholecalciferol (VITAMIN D) tablet 2,000 Units  2,000 Units Oral Daily Dustin Flock, MD      . dexamethasone (DECADRON) tablet 8 mg  8 mg Oral See admin instructions Dustin Flock, MD      . heparin injection 5,000 Units  5,000 Units Subcutaneous Q8H Dustin Flock, MD   5,000 Units at 09/09/17 0539  . HYDROcodone-acetaminophen (NORCO/VICODIN) 5-325 MG per tablet 1-2 tablet  1-2 tablet Oral Q4H PRN Dustin Flock, MD      . megestrol (MEGACE) 400 MG/10ML suspension 400 mg  400 mg Oral BID Dustin Flock, MD      . memantine [facility]) tablet 10 mg  10 mg Oral BID Dustin Flock, MD      . ondansetron [facility]) tablet 4 mg  4 mg Oral Q6H PRN Dustin Flock, MD       Or  . ondansetron (ZOFRAN) injection 4 mg  4 mg Intravenous Q6H PRN Dustin Flock, MD      . ondansetron [facility]) tablet 4 mg  4 mg Oral Q6H PRN Dustin Flock, MD      . prochlorperazine (COMPAZINE) tablet 10 mg  10 mg Oral Q6H PRN Dustin Flock, MD         Objective: Vital signs in last 24 hours: Temp:  [97.7 F (36.5 C)-98.8 F (37.1 C)] 98.8 F (37.1 C) (12/12 0533) Pulse Rate:  [85-102] 85 (12/12 0533) Resp:  [16] 16 (12/11 1627) BP: (105-156)/(57-77) 131/77 (12/12 0533) SpO2:  [99 %-100 %] 99 % (12/12 0533) Weight:  [129 lb 2 oz (58.6 kg)-130 lb (59 kg)] 129 lb 2 oz (58.6 kg) (12/11 2248)  Intake/Output from previous day: 12/11 0701 - 12/12 0700 In: 2030.7 [I.V.:980.7; IV Piggyback:1050] Out: -  Intake/Output this shift: No intake/output data recorded.   Physical Exam  Alert, attentive, confused. Abdomen soft, nontender, nondistended Foley catheter in place draining scant urine  Lab Results:  Recent Labs    09/08/17 1631 09/09/17 0442  WBC 8.3 6.5  HGB 11.1* 9.7*  HCT 33.2* 28.9*  PLT 383 318   BMET Recent Labs    09/08/17 1631 09/09/17 0442  NA 135 137  K 4.7 4.4  CL 99* 106  CO2 19* 17*  GLUCOSE 126* 95  BUN 91* 90*  CREATININE 5.55* 5.53*  CALCIUM 9.1 8.5*   PT/INR No results for input(s): LABPROT, INR in the last 72 hours. ABG No results for input(s): PHART, HCO3 in the last 72 hours.  Invalid input(s): PCO2, PO2  Studies/Results: US Renal  Result Date: 09/08/2017 CLINICAL DATA:  Acute kidney injury. Status post stent. Evaluate hydronephrosis. EXAM: RENAL / URINARY TRACT ULTRASOUND COMPLETE COMPARISON:  PET-CT 08/27/2017 FINDINGS: Right Kidney: Length: 10 cm. Advanced hydronephrosis and hydroureter. Cortical thinning seen down to 8 mm. No evidence of mass or collection. Hydronephrosis and hydroureter is similar to prior PET-CT, new from renal ultrasound 08/17/2017. A ureteral stent is seen to the level of the renal pelvis. Left Kidney: Length: 11 cm. Moderate hydronephrosis. No evidence of mass or atrophy. A stent is seen to the level of the UPJ. Bladder: Physiologically distended. No  inflammatory changes or debris noted. Bilateral ureteral stents. IMPRESSION: 1. Right greater than left hydronephrosis that is similar to 08/27/2017 PET-CT. 2. Bilateral ureteral stents in place. 3. Mild right renal cortical thinning. Electronically Signed   By: Monte Fantasia M.D.   On: 09/08/2017 19:00     Assessment: 76 year old female with metastatic and locally advanced cervical cancer with bilateral ureteral obstruction status post ureteral stent placement.  She has developed recurrence of her hydronephrosis despite ureteral stents and acute on chronic renal failure.  Minimal improvement over the past 12 hours with Foley catheter.  Lengthy discussion today with the patient and her family about overall prognosis and goals of care.  I do not feel that exchanging her stents would be beneficial as they were quite difficult to place initially and concern for high-grade obstruction.  At this point time, she was offered bilateral percutaneous nephrostomy tubes.  We discussed the implications of these tubes and need for exchange/tube care.  Alternatives including de-escalation of care with consideration of hospice could also be considered.  Palliative care and oncology consult pending.  Plan: -Patient's diet may be advanced today as no plan for intervention today -Maintain foley, monitor UOP, Cr in AM -N.p.o. at midnight with plans for bilateral percutaneous nephrostomy tubes tomorrow if the patient elects intervention -Follow-up palliative care/oncology consults    LOS: 1 day    [name] 09/09/2017

## 2017-09-09 NOTE — Consult Note (Signed)
Hematology/Oncology Consult note [facility] [phone] [phone]  Patient Care Team: Adin Hector, MD as PCP - General (Internal Medicine) Clent Jacks, RN as Registered Nurse   Name of the patient: Jenna Parrish  [id number]  Jan 10, 1941   Date of visit: 09/09/17 REASON FOR COSULTATION:  Metastatic cervical cancer.  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 Patient's husband called oncology clinic reporting that patient is very weak and not eating. Husband was advised to bring patient to ER for further evaluation.  Lab and image work up showed acute declining of kidney function, and hydronephrosis. Urology was consulted and plan for percutaneously nephrotomy tube placement.  Patient has recently diagnosed with metastatic cervical cancer. Plan was to start palliative concurrent RT with low dose chemotherapy with carboplatin AUC2 starting today, hopfully to reduce her tumor burden and relief of GU obstruction.  Patient appears confused. She denies any pain, nausea, flank discomfort. Appetite is poor.    Review of systems- ROS Not able to perform complete ROS due to patient's confused mental status.   Allergies  Allergen Reactions  . Salicylates Hives  . Aricept [Donepezil Hcl]   . Aspirin Hives  . Codeine Other (See Comments)    Syncope   . Donepezil Other (See Comments)    Sedation  . Galantamine Other (See Comments)    Dizziness    Patient Active Problem List   Diagnosis Date Noted  . Acute renal failure (ARF) ([place]) 09/08/2017  . Cervical cancer, FIGO stage IVB ([place]) 09/01/2017  . Dehydration 08/19/2017  . Goals of care, counseling/discussion 08/19/2017  . Acute kidney injury ([place]) 08/13/2017     Past Medical  History:  Diagnosis Date  . Abnormal Pap smear of cervix   . Alzheimer's dementia   . Breast cancer ([place])    left lumpectomy with node removal  . Cervical cancer ([place])   . Cervical cancer, FIGO stage IVB ([place]) 09/01/2017  . Dehydration 08/19/2017  . Dementia   . GERD (gastroesophageal reflux disease)   . Goals of care, counseling/discussion 08/19/2017  . Hydronephrosis   . Pelvic mass      Past Surgical History:  Procedure Laterality Date  . APPENDECTOMY    . BREAST LUMPECTOMY    . CERVICAL ABLATION N/A 08/12/2017   Procedure: CERVICAL BIOPSY;  Surgeon: Ouida Sills, Gwen Her, MD;  Location: [facility] ORS;  Service: Gynecology;  Laterality: N/A;  . CYSTOSCOPY W/ RETROGRADES Bilateral 08/12/2017   Procedure: CYSTOSCOPY WITH RETROGRADE PYELOGRAM;  Surgeon: [name], MD;  Location: [facility] ORS;  Service: Urology;  Laterality: Bilateral;  . CYSTOSCOPY WITH STENT PLACEMENT Bilateral 08/12/2017   Procedure: CYSTOSCOPY WITH STENT PLACEMENT;  Surgeon: [name], MD;  Location: [facility] ORS;  Service: Urology;  Laterality: Bilateral;  . PORTA CATH INSERTION N/A 09/01/2017   Procedure: PORTA CATH INSERTION;  Surgeon: Katha Cabal, MD;  Location: [place] [facility];  Service: Cardiovascular;  Laterality: N/A;  . TOTAL HIP ARTHROPLASTY Right     Social History   Socioeconomic History  . Marital status: Married    Spouse name: Not on file  . Number of children: Not on file  . Years of education: Not on file  . Highest education level: Not on file  Social Needs  . Financial resource strain: Not on file  .  Food insecurity - worry: Not on file  . Food insecurity - inability: Not on file  . Transportation needs - medical: Not on file  . Transportation needs - non-medical: Not on file  Occupational History  . Not on file  Tobacco Use  . Smoking status: Never Smoker  . Smokeless tobacco: Never Used  Substance and Sexual Activity  . Alcohol use: No    Frequency: Never  . Drug  use: No  . Sexual activity: Not on file  Other Topics Concern  . Not on file  Social History Narrative  . Not on file     Family History  Problem Relation Age of Onset  . Alzheimer's disease Mother   . Heart failure Father   . Bladder Cancer Neg Hx   . Kidney cancer Neg Hx      Current Facility-Administered Medications:  .  0.9 %  sodium chloride infusion, , Intravenous, Continuous, Dustin Flock, MD, Last Rate: 100 mL/hr at 09/08/17 2333 .  acetaminophen (TYLENOL) tablet 1,000 mg, 1,000 mg, Oral, Q6H PRN, Dustin Flock, MD .  B-complex with vitamin C tablet 1 tablet, 1 tablet, Oral, Daily, Dustin Flock, MD, 1 tablet at 09/09/17 1125 .  cefTRIAXone (ROCEPHIN) 2 g in dextrose 5 % 50 mL IVPB, 2 g, Intravenous, Q24H, Saundra Shelling, MD, Stopped at 09/09/17 [phone] .  cholecalciferol (VITAMIN D) tablet 2,000 Units, 2,000 Units, Oral, Daily, Dustin Flock, MD, 2,000 Units at 09/09/17 1125 .  dexamethasone (DECADRON) tablet 8 mg, 8 mg, Oral, See admin instructions, Dustin Flock, MD .  heparin injection 5,000 Units, 5,000 Units, Subcutaneous, Q8H, Dustin Flock, MD, 5,000 Units at 09/09/17 0539 .  HYDROcodone-acetaminophen (NORCO/VICODIN) 5-325 MG per tablet 1-2 tablet, 1-2 tablet, Oral, Q4H PRN, Dustin Flock, MD .  megestrol (MEGACE) 400 MG/10ML suspension 400 mg, 400 mg, Oral, BID, Dustin Flock, MD, 400 mg at 09/09/17 1125 .  memantine (NAMENDA) tablet 10 mg, 10 mg, Oral, BID, Dustin Flock, MD, 10 mg at 09/09/17 1125 .  ondansetron (ZOFRAN) tablet 4 mg, 4 mg, Oral, Q6H PRN **OR** ondansetron (ZOFRAN) injection 4 mg, 4 mg, Intravenous, Q6H PRN, Dustin Flock, MD .  ondansetron (ZOFRAN) tablet 4 mg, 4 mg, Oral, Q6H PRN, Dustin Flock, MD .  prochlorperazine (COMPAZINE) tablet 10 mg, 10 mg, Oral, Q6H PRN, Dustin Flock, MD   Physical exam:  Vitals:   09/08/17 2138 09/08/17 2248 09/09/17 0533 09/09/17 0815  BP:  (!) 135/57 131/77 (!) 131/41  Pulse: 91 90 85 82    Resp:      Temp:  97.7 F (36.5 C) 98.8 F (37.1 C) 99.1 F (37.3 C)  TempSrc:  Oral Oral Oral  SpO2: 99% 100% 99% 96%  Weight:  129 lb 2 oz (58.6 kg)    Height:  5\' 5"  (1.651 m)     GENERAL:Alert, comfortable.  EYES: no pallor or icterus OROPHARYNX: no thrush or ulceration; good dentition  NECK: supple, no masses felt LYMPH:  no palpable lymphadenopathy in the cervical, axillary or inguinal regions LUNGS: clear to auscultation and  No wheeze or crackles HEART/CVS: regular rate & rhythm and no murmurs; No lower extremity edema ABDOMEN: abdomen soft, non-tender and normal bowel sounds Musculoskeletal:no cyanosis of digits and no clubbing  PSYCH: confused. NEURO: no focal motor/sensory deficits SKIN:  no rashes or significant lesions     CMP Latest Ref Rng & Units 09/09/2017  Glucose 65 - 99 mg/dL 95  BUN 6 - 20 mg/dL 90(H)  Creatinine 0.44 - 1.00  mg/dL 5.53(H)  Sodium 135 - 145 mmol/L 137  Potassium 3.5 - 5.1 mmol/L 4.4  Chloride 101 - 111 mmol/L 106  CO2 22 - 32 mmol/L 17(L)  Calcium 8.9 - 10.3 mg/dL 8.5(L)  Total Protein 6.5 - 8.1 g/dL -  Total Bilirubin 0.3 - 1.2 mg/dL -  Alkaline Phos 38 - 126 U/L -  AST 15 - 41 U/L -  ALT 14 - 54 U/L -   CBC Latest Ref Rng & Units 09/09/2017  WBC 3.6 - 11.0 K/uL 6.5  Hemoglobin 12.0 - 16.0 g/dL 9.7(L)  Hematocrit 35.0 - 47.0 % 28.9(L)  Platelets 150 - 440 K/uL 318    @IMAGES @  Ct Head Wo Contrast  Result Date: 08/18/2017 CLINICAL DATA:  Dizziness x1 month, unsteady gait EXAM: CT HEAD WITHOUT CONTRAST TECHNIQUE: Contiguous axial images were obtained from the base of the skull through the vertex without intravenous contrast. COMPARISON:  None. FINDINGS: Brain: No evidence of acute infarction, hemorrhage, hydrocephalus, extra-axial collection or mass lesion/mass effect. Mild cortical atrophy. Vascular: Intracranial atherosclerosis. Skull: Normal. Negative for fracture or focal lesion. Sinuses/Orbits: The visualized paranasal  sinuses are essentially clear. The mastoid air cells are unopacified. Other: None. IMPRESSION: No evidence of acute intracranial abnormality. Mild cortical atrophy. These results will be called to the ordering clinician or representative by the Radiology Department at the imaging location. Electronically Signed   By: Julian Hy M.D.   On: 08/18/2017 12:42   US Renal  Result Date: 09/08/2017 CLINICAL DATA:  Acute kidney injury. Status post stent. Evaluate hydronephrosis. EXAM: RENAL / URINARY TRACT ULTRASOUND COMPLETE COMPARISON:  PET-CT 08/27/2017 FINDINGS: Right Kidney: Length: 10 cm. Advanced hydronephrosis and hydroureter. Cortical thinning seen down to 8 mm. No evidence of mass or collection. Hydronephrosis and hydroureter is similar to prior PET-CT, new from renal ultrasound 08/17/2017. A ureteral stent is seen to the level of the renal pelvis. Left Kidney: Length: 11 cm. Moderate hydronephrosis. No evidence of mass or atrophy. A stent is seen to the level of the UPJ. Bladder: Physiologically distended. No inflammatory changes or debris noted. Bilateral ureteral stents. IMPRESSION: 1. Right greater than left hydronephrosis that is similar to 08/27/2017 PET-CT. 2. Bilateral ureteral stents in place. 3. Mild right renal cortical thinning. Electronically Signed   By: Monte Fantasia M.D.   On: 09/08/2017 19:00   US Renal  Result Date: 08/17/2017 CLINICAL DATA:  History bilateral hydronephrosis greater on the right than on the left EXAM: RENAL / URINARY TRACT ULTRASOUND COMPLETE COMPARISON:  Abdominal and pelvic CT scan of August 07, 2017 and retrograde urogram on the right of August 12, 2017. FINDINGS: Right Kidney: Length: 9.0 cm. The renal cortical echotexture is slightly lower than that of the adjacent liver. A ureteral stent is present There is mild cortical thinning. There is no hydronephrosis or perinephric fluid collection. Left Kidney: Length: 10.5 cm. Echogenicity within normal limits.  No mass or hydronephrosis visualized. A ureteral stent is present. Bladder: Ureteral stents are present bilaterally. The partially distended urinary bladder is otherwise normal. Incidental note is made of gallstones. IMPRESSION: There has been resolution of the hydronephrosis since stent placement. The right kidney appears smaller than the left. **An incidental finding of potential clinical significance has been found. Multiple gallstones are demonstrated**. Electronically Signed   By: David  Martinique M.D.   On: 08/17/2017 15:34   Nm Pet Image Initial (pi) Skull Base To Thigh  Result Date: 08/27/2017 CLINICAL DATA:  Initial. Treatment strategy for cervical cancer. EXAM: NUCLEAR  MEDICINE PET SKULL BASE TO THIGH TECHNIQUE: 13 mCi F-18 FDG was injected intravenously. Full-ring PET imaging was performed from the skull base to thigh after the radiotracer. CT data was obtained and used for attenuation correction and anatomic localization. FASTING BLOOD GLUCOSE:  Value: 94 mg/dl COMPARISON:  None. FINDINGS: NECK: No hypermetabolic lymph nodes in the neck. CHEST: No definite hypermetabolic mediastinal or hilar lymphadenopathy. There is some FDG uptake in the inferior left hilum but no underlying lymph node can be identified. 8 mm short axis right retrocrural lymph node (image 126 series 3) is hypermetabolic with SUV max = 5.1. 10 mm posterior right lower lobe nodule (image 114 series 3) shows low level hypermetabolism with SUV max = 2.5. 9 mm nodule in the deep posterior right costophrenic sulcus (image 121 series 3) is probably hypermetabolic but difficult to assess given the adjacent hepatic uptake. Biapical pleural-parenchymal scarring is evident in the lungs. No other suspicious hypermetabolic pulmonary nodule or mass is evident. Coronary artery and thoracic aortic atherosclerosis is evident. ABDOMEN/PELVIS: Previously identified retroperitoneal lymphadenopathy is hypermetabolic. 2.1 cm aortocaval lymph node seen on  image 155 series 3 demonstrates SUV max = 14.3. There is hypermetabolic lymphadenopathy in both pelvic sidewalls. This is difficult to assess given lack of intravenous contrast material. There is an apparent nodal conglomeration along the right pelvic sidewall measuring 2.0 cm (image 198 series 3) with SUV max = 15.3. It is possible this could represent the patient's ovary as this area is not well demonstrated on this noncontrast exam. Small nodules adjacent to the cervix are hypermetabolic. Index 16 mm nodule just to the right of the cervix on image 208 of series 3 is hypermetabolic. Apparent 11 mm right groin lymph node (image 231) demonstrates hypermetabolism with SUV max = 6.5 The cervix is markedly hypermetabolic and hypermetabolism extends into the uterus. SUV max = 16.9. No abnormal hypermetabolic activity within the liver, pancreas, adrenal glands, or spleen. Persistent bilateral hydroureteronephrosis although decreased with bilateral internal ureteral stents now visualized in situ. SKELETON: There is a markedly hypermetabolic lesion in the left acetabular roof with SUV max = 6.1. No underlying bone lesion evident on CT imaging. Several other foci of subtle FDG accumulation in the skeleton are identified. Anterior right first rib demonstrates SUV max = 2.8. Posterior right ninth rib demonstrates SUV max = 1.8. Inferior left pubic ramus demonstrates SUV max = 2.6. No underlying lesions are seen at these locations on CT imaging. IMPRESSION: 1. Markedly hypermetabolic cervix and uterus compatible with the patient's reported history of cervical neoplasm. 2. Hypermetabolic right retrocrural, abdominal retroperitoneal, bilateral pelvic sidewall and right groin lymphadenopathy. There are hypermetabolic soft tissue nodules adjacent to the cervix consistent with metastatic disease. 3. Small right lower lobe pulmonary nodules show low level hypermetabolism, concerning for pulmonary metastatic disease. 4.  Hypermetabolic bone lesion left acetabulum without other subtle areas of bony FDG accumulation, concerning for metastatic skeletal involvement. 5. Bilateral hydroureteronephrosis, decreased in the interval with bilateral internal ureteral stents identified in situ. Electronically Signed   By: Misty Stanley M.D.   On: 08/27/2017 14:52   Dg C-arm 1-60 Min-no Report  Result Date: 08/12/2017 Fluoroscopy was utilized by the requesting physician.  No radiographic interpretation.    Assessment and plan- Patient is a 76 y.o. female with metastatic cervical cancer induced obstructive uropathy who is presenting with generalized weakness noted to have acute renal failure  # Acute obstructive renal failure, likely due to tumor compression.  # Confusion # Generalized weakness.  #  advanced dementia  Discussed with patient's husband and daughter at bedside. Prognosis is poor. Patient was originally scheduled for palliative chemotherapy and radiation, however, given the decline of her performance status, she is not candidate for any chemotherapy for now and she is less likely to recover to the condition to be strong enough to start chemotherapy. I think hospice is appropriate at this point given her widely metastatic disease, poor performance status. I think she will benefit palliative percutaneous nephrostomy tubes procedure if feasible. Husband and daughter asked questions and all questions answered to satisfaction. They are leaning towards hospice after procedure is done. They will think it over and will update primary team. Emotional support was provided.   Thank you for this kind referral and the opportunity to participate in the care of this patient  Dr. Earlie Server, MD, PhD [facility] at [facility] Pager- [phone] 09/09/2017

## 2017-09-09 NOTE — Plan of Care (Addendum)
Pt admitted last night. Urologist seen in ED. Placed urinary catheter. UA sent. UA positive. Pharmacy consultation for Rocephin. Pt confused and impulsive throughout the night requesting to go pee. Explained to pt several times that she has in a tube to drain her bladder. Pt currently, NPO in case further procedures needed per urologist note.  Education: Knowledge of General Education information will improve 09/09/2017 0411 - Not Progressing by Jeffie Pollock, RN   Clinical Measurements: Ability to maintain clinical measurements within normal limits will improve 09/09/2017 0411 - Progressing by Jeffie Pollock, RN Will remain free from infection 09/09/2017 0411 - Not Progressing by Jeffie Pollock, RN Diagnostic test results will improve 09/09/2017 0411 - Not Progressing by Jeffie Pollock, RN Respiratory complications will improve 09/09/2017 0411 - Progressing by Jeffie Pollock, RN Cardiovascular complication will be avoided 09/09/2017 0411 - Progressing by Jeffie Pollock, RN   Safety: Ability to remain free from injury will improve 09/09/2017 0411 - Progressing by Jeffie Pollock, RN   Activity: Ability to implement measures to reduce episodes of fatigue will improve 09/09/2017 0411 - Not Progressing by Jeffie Pollock, RN   Bowel/Gastric: Will not experience complications related to bowel motility 09/09/2017 0411 - Progressing by Jeffie Pollock, RN   Coping: Ability to identify and develop effective coping behavior will improve 09/09/2017 0411 - Not Progressing by Jeffie Pollock, RN   Nutritional: Maintenance of adequate nutrition will improve 09/09/2017 0411 - Not Progressing by Jeffie Pollock, RN

## 2017-09-10 ENCOUNTER — Inpatient Hospital Stay: Payer: Medicare Other

## 2017-09-10 ENCOUNTER — Ambulatory Visit: Payer: Medicare Other

## 2017-09-10 DIAGNOSIS — E872 Acidosis, unspecified: Secondary | ICD-10-CM

## 2017-09-10 DIAGNOSIS — G309 Alzheimer's disease, unspecified: Secondary | ICD-10-CM

## 2017-09-10 DIAGNOSIS — Z515 Encounter for palliative care: Secondary | ICD-10-CM

## 2017-09-10 DIAGNOSIS — F0280 Dementia in other diseases classified elsewhere without behavioral disturbance: Secondary | ICD-10-CM

## 2017-09-10 DIAGNOSIS — N1330 Unspecified hydronephrosis: Secondary | ICD-10-CM

## 2017-09-10 DIAGNOSIS — Z7189 Other specified counseling: Secondary | ICD-10-CM

## 2017-09-10 DIAGNOSIS — C770 Secondary and unspecified malignant neoplasm of lymph nodes of head, face and neck: Secondary | ICD-10-CM

## 2017-09-10 HISTORY — PX: IR NEPHROSTOMY PLACEMENT RIGHT: IMG6064

## 2017-09-10 HISTORY — PX: IR NEPHROSTOMY PLACEMENT LEFT: IMG6063

## 2017-09-10 LAB — BASIC METABOLIC PANEL
Anion gap: 9 (ref 5–15)
BUN: 93 mg/dL — AB (ref 6–20)
CALCIUM: 7.6 mg/dL — AB (ref 8.9–10.3)
CO2: 17 mmol/L — ABNORMAL LOW (ref 22–32)
Chloride: 110 mmol/L (ref 101–111)
Creatinine, Ser: 7.15 mg/dL — ABNORMAL HIGH (ref 0.44–1.00)
GFR calc Af Amer: 6 mL/min — ABNORMAL LOW (ref >60)
GFR, EST NON AFRICAN AMERICAN: 5 mL/min — AB (ref >60)
GLUCOSE: 93 mg/dL (ref 65–99)
POTASSIUM: 4.2 mmol/L (ref 3.5–5.1)
Sodium: 136 mmol/L (ref 135–145)

## 2017-09-10 LAB — PROTIME-INR
INR: 1.14
PROTHROMBIN TIME: 14.5 s (ref 11.4–15.2)

## 2017-09-10 LAB — URINE CULTURE: Culture: NO GROWTH

## 2017-09-10 MED ORDER — MIDAZOLAM HCL 5 MG/5ML IJ SOLN
INTRAMUSCULAR | Status: AC | PRN
  Administered 2017-09-10: 1 mg via INTRAVENOUS
  Administered 2017-09-10: 0.5 mg via INTRAVENOUS

## 2017-09-10 MED ORDER — FENTANYL CITRATE (PF) 100 MCG/2ML IJ SOLN
INTRAMUSCULAR | Status: AC
  Filled 2017-09-10: qty 4

## 2017-09-10 MED ORDER — MIDAZOLAM HCL 5 MG/5ML IJ SOLN
INTRAMUSCULAR | Status: AC
  Filled 2017-09-10: qty 5

## 2017-09-10 MED ORDER — CEFAZOLIN SODIUM-DEXTROSE 2-4 GM/100ML-% IV SOLN: 2.0000 g | Freq: Once | INTRAVENOUS | Status: DC

## 2017-09-10 MED ORDER — FENTANYL CITRATE (PF) 100 MCG/2ML IJ SOLN
INTRAMUSCULAR | Status: AC | PRN
  Administered 2017-09-10: 50 ug via INTRAVENOUS
  Administered 2017-09-10: 12.5 ug via INTRAVENOUS

## 2017-09-10 MED ORDER — LIDOCAINE HCL (PF) 1 % IJ SOLN
20.0000 mL | Freq: Once | INTRAMUSCULAR | Status: DC
  Filled 2017-09-10: qty 20

## 2017-09-10 MED ORDER — IOPAMIDOL (ISOVUE-300) INJECTION 61%
50.0000 mL | Freq: Once | INTRAVENOUS | Status: AC | PRN
  Administered 2017-09-10: 20 mL

## 2017-09-10 MED ORDER — LIDOCAINE HCL (PF) 1 % IJ SOLN
INTRAMUSCULAR | Status: AC
  Filled 2017-09-10: qty 30

## 2017-09-10 MED ORDER — DEXTROSE 5 % IV SOLN
2.0000 g | Freq: Once | INTRAVENOUS | Status: AC
  Administered 2017-09-10: 2 g via INTRAVENOUS
  Filled 2017-09-10: qty 2000

## 2017-09-10 MED ORDER — FENTANYL CITRATE (PF) 100 MCG/2ML IJ SOLN: 25.0000 ug | Freq: Once | INTRAMUSCULAR | Status: DC

## 2017-09-10 MED ORDER — SODIUM CHLORIDE 0.9% FLUSH
5.0000 mL | Freq: Three times a day (TID) | INTRAVENOUS | Status: DC
  Administered 2017-09-10 – 2017-09-12 (×4): 5 mL via INTRAVENOUS

## 2017-09-10 NOTE — Plan of Care (Signed)
  Education: Knowledge of General Education information will improve 09/10/2017 0326 - Not Progressing by Jeffie Pollock, RN   Clinical Measurements: Ability to maintain clinical measurements within normal limits will improve 09/10/2017 0326 - Progressing by Jeffie Pollock, RN Will remain free from infection 09/10/2017 0326 - Not Progressing by Jeffie Pollock, RN Diagnostic test results will improve 09/10/2017 0326 - Not Progressing by Jeffie Pollock, RN Respiratory complications will improve 09/10/2017 0326 - Progressing by Jeffie Pollock, RN Cardiovascular complication will be avoided 09/10/2017 0326 - Progressing by Jeffie Pollock, RN   Safety: Ability to remain free from injury will improve 09/10/2017 0326 - Progressing by Jeffie Pollock, RN   Activity: Ability to implement measures to reduce episodes of fatigue will improve 09/10/2017 0326 - Progressing by Jeffie Pollock, RN   Bowel/Gastric: Will not experience complications related to bowel motility 09/10/2017 0326 - Progressing by Jeffie Pollock, RN   Coping: Ability to identify and develop effective coping behavior will improve 09/10/2017 0326 - Progressing by Jeffie Pollock, RN   Nutritional: Maintenance of adequate nutrition will improve 09/10/2017 0326 - Not Progressing by Jeffie Pollock, RN

## 2017-09-10 NOTE — Consult Note (Signed)
Consultation Note Date: 09/10/17  Patient Name: Jenna Parrish  DOB: [birth year]  MRN: [id number]  Age / Sex: 76 y.o., female  PCP: Adin Hector, MD Referring Physician: [name], *  Reason for Consultation: Establishing goals of care  HPI/Patient Profile: 76 y.o. female  with past medical history of Alzheimer's dementia, metastatic cervical cancer, hydronephrosis, and GERD admitted on 09/08/2017 with weakness and dizziness. Recently diagnosed with metastatic cervical cancer. 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 She had bilateral stents placed on 08/12/17 and cervical biopsy that was positive for poorly differentiated carcinoma. PET CT on 08/27/17 revealed metastatic cervical cancer with return of R>L hydronephrosis. In ED, patient with weakness and confusion. Creatinine 5.55 with BUN of 81. Ultrasound confirmed R>L hydronephrosis. Oncology following. Due to performance status decline with underlying dementia, patient is not a candidate for palliative chemotherapy or radiation. Oncology recommending hospice services. Palliative medicine consultation for goals of care.   Clinical Assessment and Goals of Care: I have reviewed medical records, discussed with care team, and met with husband Gwyndolyn Saxon) and daughter Helene Kelp) at bedside to discuss diagnosis, prognosis, GOC, EOL wishes, disposition and options. Patient current in IR receiving nephrostomy tubes. She has remained very confused, repeating the same sentences to family. She is afraid to be left alone.   Introduced Palliative Medicine as specialized medical care for people living with serious illness. It focuses on providing relief from the symptoms and stress of a serious illness. The goal is to improve quality of life for both the patient and the family.  We discussed a brief life review of  the patient. Married to [place] for 35 years and with two adult daughters (one living in [place] and another in [place]). Diagnosed with dementia in 2012. She has remained living home with husband as primary caregiver. Gwyndolyn Saxon and Clarene Critchley speak of a great decline in the last month since diagnosis of cancer including increased weakness, confusion, and poor appetite.   Discussed hospital diagnoses and interventions. Family was planning to start palliative chemotherapy and radiation on 12/12 but she was then hospitalized. Charlotta Newton spoken with attending, oncology, and urology. They understand diagnoses and that she is no longer a candidate for chemo/radiation due to declining functional/nutritional status. They have a good understanding of guarded prognosis and were told less than a month.   Advanced directives, concepts specific to code status, artifical feeding and hydration, and rehospitalization were considered and discussed. Patient has a documented living will will HCPOA. Reviewed in epic with family. Husband confirms her wishes against aggressive interventions if she was terminally ill, including resuscitation, life support, and feeding tube. Her living will includes the desire to die a natural death.   Educated and completed MOST form with husband. DNR/DNI, comfort measures, IVF/ABX if indicated, and no feeding tube. Durable DNR completed.    The difference between aggressive medical intervention and comfort care was considered in light of the patient's goals of care. We discussed discharge options. Helene Kelp tells me they may  discharge her to rehab after nephrostomy tubes are placed. Discussed SNF for rehab for aggressive physical therapy versus focusing on her comfort and quality at EOL (knowing time is short). Charlotta Newton agree for focus on comfort measures and do not wish for Ms. Givhan to be "pushed" at rehab to do therapy.   A hospice liaison met with them last Saturday before  hospitalization. At this time, the family was interested in palliative services. Gwyndolyn Saxon and Helene Kelp are now requesting hospice services on discharge understanding focus on comfort, symptom management, and prevention re-hospitalization at EOL. They prefer a hospice facility. We discussed comfort feeds. They understand medications and interventions not aimed at comfort will be discontinued at the hospice facility.  Gwyndolyn Saxon and Helene Kelp are hopeful she may show some improvement in mental status once nephrostomy tubes are placed but remain realistic in guarded prognosis secondary to metastatic cancer, hydronephrosis contributing to acute renal failure, and declining functional/nutritional/cognitive status.   Questions and concerns were addressed. Emotional support provided.    SUMMARY OF RECOMMENDATIONS    Patient has a documented living will with HCPOA. She desires to die a natural death.   Completed MOST form with husband. DNR/DNI, comfort measures, ABX/IVF if indicated, no feeding tube.   Durable DNR placed in chart.   Will continue current interventions while hospitalized. Post-nephrostomy creatinine in AM.   SW consult for residential hospice placement.   PMT will continue to support patient/family through hospitalization.   Code Status/Advance Care Planning:  DNR  Palliative Prophylaxis:   Aspiration, Delirium Protocol, Frequent Pain Assessment, Oral Care and Turn Reposition  Psycho-social/Spiritual:   Desire for further Chaplaincy support: yes  Additional Recommendations: Caregiving  Support/Resources and Education on Hospice  Prognosis:   Likely weeks with metastatic cervical cancer, bilateral hydronephrosis, acute renal failure, underlying dementia, and severe functional/nutritional/cognitive status decline.   Discharge Planning: To Be Determined Family hopeful for residential hospice facility     Primary Diagnoses: Present on Admission: . Acute renal failure (ARF)  ([facility])   I have reviewed the medical record, interviewed the patient and family, and examined the patient. The following aspects are pertinent.  Past Medical History:  Diagnosis Date  . Abnormal Pap smear of cervix   . Alzheimer's dementia   . Breast cancer ([facility])    left lumpectomy with node removal  . Cervical cancer ([facility])   . Cervical cancer, FIGO stage IVB ([facility] [facility]) 09/01/2017  . Dehydration 08/19/2017  . Dementia   . GERD (gastroesophageal reflux disease)   . Goals of care, counseling/discussion 08/19/2017  . Hydronephrosis   . Pelvic mass    Social History   Socioeconomic History  . Marital status: Married    Spouse name: None  . Number of children: None  . Years of education: None  . Highest education level: None  Social Needs  . Financial resource strain: None  . Food insecurity - worry: None  . Food insecurity - inability: None  . Transportation needs - medical: None  . Transportation needs - non-medical: None  Occupational History  . None  Tobacco Use  . Smoking status: Never Smoker  . Smokeless tobacco: Never Used  Substance and Sexual Activity  . Alcohol use: No    Frequency: Never  . Drug use: No  . Sexual activity: None  Other Topics Concern  . None  Social History Narrative  . None   Family History  Problem Relation Age of Onset  . Alzheimer's disease Mother   . Heart failure Father   .  Bladder Cancer Neg Hx   . Kidney cancer Neg Hx    Scheduled Meds: . B-complex with vitamin C  1 tablet Oral Daily  . cholecalciferol  2,000 Units Oral Daily  . dexamethasone  8 mg Oral See admin instructions  . feeding supplement (ENSURE ENLIVE)  237 mL Oral TID BM  . heparin  5,000 Units Subcutaneous Q8H  . lidocaine (PF)  20 mL Intradermal Once  . memantine  10 mg Oral BID  . sodium chloride flush  5 mL Intravenous Q8H   Continuous Infusions: . sodium chloride 100 mL/hr at 09/11/17 0412  . cefTRIAXone (ROCEPHIN) IVPB 2 gram/50 mL D5W ([facility]) Stopped  (09/11/17 0625)   PRN Meds:.acetaminophen, HYDROcodone-acetaminophen, ondansetron **OR** ondansetron (ZOFRAN) IV, ondansetron, prochlorperazine Medications Prior to Admission:  Prior to Admission medications   Medication Sig Start Date End Date Taking? Authorizing Provider  acetaminophen (TYLENOL) 500 MG tablet Take 1,000 mg by mouth every 6 (six) hours as needed (for pain.).   Yes [provider]  b complex vitamins tablet Take 1 tablet by mouth daily.   Yes [provider]  Cholecalciferol (VITAMIN D) 2000 units tablet Take 2,000 Units by mouth daily.   Yes [provider]  lidocaine-prilocaine (EMLA) cream Apply 1 application topically as needed. Apply small amount to port site at least 1 hour prior to it being accessed, cover with plastic wrap 08/28/17  Yes Earlie Server, MD  memantine (NAMENDA) 10 MG tablet Take 10 mg by mouth 2 (two) times daily.  08/19/16 08/28/18 Yes [provider]  ondansetron (ZOFRAN) 4 MG tablet Take 1 tablet (4 mg total) by mouth every 6 (six) hours as needed for nausea, vomiting or refractory nausea / vomiting. 09/02/17  Yes Earlie Server, MD  prochlorperazine (COMPAZINE) 10 MG tablet Take 1 tablet (10 mg total) by mouth every 6 (six) hours as needed for nausea or vomiting. 09/02/17  Yes Earlie Server, MD  dexamethasone (DECADRON) 4 MG tablet Take 2 tablets (8 mg total) by mouth See admin instructions. Take 2 tablets (8 mg total) by mouth daily. Start the day after chemotherapy for 2 days 09/02/17   Earlie Server, MD   Allergies  Allergen Reactions  . Salicylates Hives  . Aricept [Donepezil Hcl]   . Aspirin Hives  . Codeine Other (See Comments)    Syncope   . Donepezil Other (See Comments)    Sedation  . Galantamine Other (See Comments)    Dizziness   Review of Systems  Unable to perform ROS: Dementia    Physical Exam  Constitutional: She appears cachectic. She appears ill.  Cardiovascular: Regular rhythm.  Pulmonary/Chest: Effort normal.   Abdominal: There is no tenderness.  Genitourinary:  Genitourinary Comments: New bilateral nephrostomy tubes  Neurological: She is alert. She is disoriented.  Skin: Skin is warm and dry. There is pallor.  Psychiatric: Her speech is delayed. Cognition and memory are impaired. She is inattentive.  Nursing note and vitals reviewed.  Vital Signs: BP (!) 130/55 (BP Location: Right Arm)   Pulse 91   Temp 99.4 F (37.4 C) (Oral)   Resp 16   Ht _0  (1.651 m)   Wt 58.6 kg (129 lb 2 oz)   SpO2 97%   BMI 21.49 kg/m  Pain Assessment: No/denies pain POSS *See Group Information*: S-Acceptable,Sleep, easy to arouse Pain Score: Asleep  SpO2: SpO2: 97 % O2 Device:SpO2: 97 % O2 Flow Rate: .O2 Flow Rate (L/min): 2 L/min  IO: Intake/output summary:   Intake/Output  Summary (Last 24 hours) at 09/11/2017 0830 Last data filed at 09/11/2017 0430 Gross per 24 hour  Intake 1029.33 ml  Output 1775 ml  Net -745.67 ml    LBM: Last BM Date: 09/08/17 Baseline Weight: Weight: 59 kg (130 lb) Most recent weight: Weight: 58.6 kg (129 lb 2 oz)     Palliative Assessment/Data: PPS 30%   Flowsheet Rows     Most Recent Value  Intake Tab  Referral Department  Hospitalist  Unit at Time of Referral  Oncology Unit  Palliative Care Primary Diagnosis  Cancer  Palliative Care Type  New Palliative care  Date first seen by Palliative Care  09/11/17  Clinical Assessment  Palliative Performance Scale Score  30%  Psychosocial & Spiritual Assessment  Palliative Care Outcomes  Patient/Family meeting held?  Yes  Who was at the meeting?  patient, husband, daughter  Palliative Care Outcomes  Clarified goals of care, Provided end of life care assistance, Provided psychosocial or spiritual support, Changed CPR status, Transitioned to hospice, ACP counseling assistance, Counseled regarding hospice, Provided advance care planning, Completed durable DNR      Time In: 1600 Time Out: 1720 Time Total: 29mn Greater  than 50%  of this time was spent counseling and coordinating care related to the above assessment and plan.  Signed by:  MIhor Dow FNP-C Palliative Medicine Team  Phone: [phone]: [phone]  Please contact Palliative Medicine Team phone at [phone] questions and concerns.  For individual provider: See AShea Evans

## 2017-09-10 NOTE — Progress Notes (Signed)
This NP met with husband and daughter at bedside. Patient in IR during our conversation. Goals of care discussed. Husband and daughter understand diagnoses, interventions, and poor prognosis. Patient has documented living will with desire for natural death. Completed MOST form with husband. He confirmed DNR/DNI and focus on comfort and symptom management. Will continue current interventions at this time (including meds and labs to re-evaluate creatinine in AM). Disposition plan will be with hospice services. Full palliative note to follow.   NO CHARGE  Ihor Dow, FNP-C Palliative Medicine Team  Phone: [phone] Fax: [phone]

## 2017-09-10 NOTE — Progress Notes (Signed)
Spoke with Dr. Erlene Quan this morning. Patient has had no urine output recorded since admission. Foley catheter in place- advanced to ensure placement. IVF's infusing as ordered. 200 ml in foley bag. Pelvis/Abdomen not distended, no c/o pain. Will continue to closely monitor.

## 2017-09-10 NOTE — Progress Notes (Signed)
[facility] [place] NAME: Jenna Parrish    MR#:  [id number]  DATE OF BIRTH:  1941-08-19  SUBJECTIVE:  CHIEF COMPLAINT:   Chief Complaint  Patient presents with  . Weakness  . Dizziness     Came with new diagnosis of cancer, mets, urinary retention and recent ureteral stenting. Decreased appetite, weakness and acute renal failure now. Have minimal urine output. REVIEW OF SYSTEMS:  CONSTITUTIONAL: No fever, positive for fatigue or weakness.  EYES: No blurred or double vision.  EARS, NOSE, AND THROAT: No tinnitus or ear pain.  RESPIRATORY: No cough, shortness of breath, wheezing or hemoptysis.  CARDIOVASCULAR: No chest pain, orthopnea, edema.  GASTROINTESTINAL: No nausea, vomiting, diarrhea or abdominal pain.  GENITOURINARY: No dysuria, hematuria.  ENDOCRINE: No polyuria, nocturia,  HEMATOLOGY: No anemia, easy bruising or bleeding SKIN: No rash or lesion. MUSCULOSKELETAL: No joint pain or arthritis.   NEUROLOGIC: No tingling, numbness, generalized weakness.  PSYCHIATRY: No anxiety or depression.   ROS  DRUG ALLERGIES:   Allergies  Allergen Reactions  . Salicylates Hives  . Aricept [Donepezil Hcl]   . Aspirin Hives  . Codeine Other (See Comments)    Syncope   . Donepezil Other (See Comments)    Sedation  . Galantamine Other (See Comments)    Dizziness    VITALS:  Blood pressure (!) 102/46, pulse 70, temperature 98.3 F (36.8 C), temperature source Oral, resp. rate 19, height 5\' 5"  (1.651 m), weight 58.6 kg (129 lb 2 oz), SpO2 98 %.  PHYSICAL EXAMINATION:  GENERAL:  76 y.o.-year-old thin patient lying in the bed with no acute distress.  EYES: Pupils equal, round, reactive to light and accommodation. No scleral icterus. Extraocular muscles intact.  HEENT: Head atraumatic, normocephalic. Oropharynx and nasopharynx clear.  NECK:  Supple, no jugular venous distention. No thyroid enlargement, no tenderness.  LUNGS: Normal  breath sounds bilaterally, no wheezing, rales,rhonchi or crepitation. No use of accessory muscles of respiration.  CARDIOVASCULAR: S1, S2 normal. No murmurs, rubs, or gallops.  ABDOMEN: Soft, mild tender, nondistended. Bowel sounds present. No organomegaly or mass.  EXTREMITIES: No pedal edema, cyanosis, or clubbing.  NEUROLOGIC: Cranial nerves II through XII are intact. Muscle strength 3-4/5 in all extremities. Sensation intact. Gait not checked.  PSYCHIATRIC: The patient is alert and oriented x 2.  SKIN: No obvious rash, lesion, or ulcer.   Physical Exam LABORATORY PANEL:   CBC Recent Labs  Lab 09/09/17 0442  WBC 6.5  HGB 9.7*  HCT 28.9*  PLT 318   ------------------------------------------------------------------------------------------------------------------  Chemistries  Recent Labs  Lab 09/09/17 0442  NA 137  K 4.4  CL 106  CO2 17*  GLUCOSE 95  BUN 90*  CREATININE 5.53*  CALCIUM 8.5*   ------------------------------------------------------------------------------------------------------------------  Cardiac Enzymes No results for input(s): TROPONINI in the last 168 hours. ------------------------------------------------------------------------------------------------------------------  RADIOLOGY:  US Renal  Result Date: 09/08/2017 CLINICAL DATA:  Acute kidney injury. Status post stent. Evaluate hydronephrosis. EXAM: RENAL / URINARY TRACT ULTRASOUND COMPLETE COMPARISON:  PET-CT 08/27/2017 FINDINGS: Right Kidney: Length: 10 cm. Advanced hydronephrosis and hydroureter. Cortical thinning seen down to 8 mm. No evidence of mass or collection. Hydronephrosis and hydroureter is similar to prior PET-CT, new from renal ultrasound 08/17/2017. A ureteral stent is seen to the level of the renal pelvis. Left Kidney: Length: 11 cm. Moderate hydronephrosis. No evidence of mass or atrophy. A stent is seen to the level of the UPJ. Bladder: Physiologically distended. No inflammatory  changes or  debris noted. Bilateral ureteral stents. IMPRESSION: 1. Right greater than left hydronephrosis that is similar to 08/27/2017 PET-CT. 2. Bilateral ureteral stents in place. 3. Mild right renal cortical thinning. Electronically Signed   By: Monte Fantasia M.D.   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 acute renal failure could be related to dehydration, or obstruction of her previous stents   IV fluids Urologist consult appreciated, plan for nephrostomy tubes.  2.  Metastatic cervical cancer  oncology to discuss prognosis and further plan. Also asked palliative care team to see Continue dexamethasone  3.  Poor appetite start patient on Megace  4.  Dementia committed continue Namenda  5.  Miscellaneous heparin for DVT prophylaxis  6. UTI   Cont rocephin, ur cx sent.   All the records are reviewed and case discussed with Care Management/Social Workerr. Management plans discussed with the patient, family and they are in agreement.  CODE STATUS: Full.  TOTAL TIME TAKING CARE OF THIS PATIENT: 35 minutes.     POSSIBLE D/C IN 1-2 DAYS, DEPENDING ON CLINICAL CONDITION.   [name] M.D on 09/10/2017   Between 7am to 6pm - Pager - [phone]  After 6pm go to [email or web address] - password [email or web address] [facility]  Office  [phone]  CC: Primary care physician; Adin Hector, MD  Note: This dictation was prepared with Dragon dictation along with smaller phrase technology. Any transcriptional errors that result from this process are unintentional.

## 2017-09-10 NOTE — Procedures (Signed)
Interventional Radiology Procedure Note  Procedure: Bilateral percutaneous nephrostomy tube placement  Complications: None  Estimated Blood Loss: < 10 mL  Findings: Significant bilateral hydronephrosis, R>L. Bilateral 10 Fr PCN's placed and formed in renal pelvis bilaterally.  Attached to gravity bags.  Venetia Night. Kathlene Cote, M.D Pager:  [phone]

## 2017-09-10 NOTE — H&P (Signed)
Chief Complaint: Patient was seen in consultation today for bilateral percutaneous nephrostomy at the request of Dr. [name]  Referring Physician(s): [name]  Patient Status: [facility] - In-pt  History of Present Illness: Jenna Parrish is a 76 y.o. female with advanced cervical carcinoma and bilateral ureteral obstruction now developing severe bilateral hydronephrosis, worsening renal failure and anuria secondary to progressive renal obstruction despite bilateral ureteral stents.  Referred for bilateral nephrostomy tube placement.  Currently denies any pain.  Past Medical History:  Diagnosis Date  . Abnormal Pap smear of cervix   . Alzheimer's dementia   . Breast cancer ([place])    left lumpectomy with node removal  . Cervical cancer ([place])   . Cervical cancer, FIGO stage IVB ([place]) 09/01/2017  . Dehydration 08/19/2017  . Dementia   . GERD (gastroesophageal reflux disease)   . Goals of care, counseling/discussion 08/19/2017  . Hydronephrosis   . Pelvic mass     Past Surgical History:  Procedure Laterality Date  . APPENDECTOMY    . BREAST LUMPECTOMY    . CERVICAL ABLATION N/A 08/12/2017   Procedure: CERVICAL BIOPSY;  Surgeon: Ouida Sills, Gwen Her, MD;  Location: [facility] OR[facility];  Service: Gynecology;  Laterality: N/A;  . CYSTOSCOPY W/ RETROGRADES Bilateral 08/12/2017   Procedure: CYSTOSCOPY WITH RETROGRADE PYELOGRAM;  Surgeon: [name], MD;  Location: [facility] OR[facility];  Service: Urology;  Laterality: Bilateral;  . CYSTOSCOPY WITH STENT PLACEMENT Bilateral 08/12/2017   Procedure: CYSTOSCOPY WITH STENT PLACEMENT;  Surgeon: [name], MD;  Location: [facility];  Service: Urology;  Laterality: Bilateral;  . PORTA CATH INSERTION N/A 09/01/2017   Procedure: PORTA CATH INSERTION;  Surgeon: Katha Cabal, MD;  Location: [facility];  Service: Cardiovascular;  Laterality: N/A;  . TOTAL HIP ARTHROPLASTY Right     Allergies: Salicylates; Aricept [donepezil  hcl]; Aspirin; Codeine; Donepezil; and Galantamine  Medications: Prior to Admission medications   Medication Sig Start Date End Date Taking? Authorizing Provider  acetaminophen (TYLENOL) 500 MG tablet Take 1,000 mg by mouth every 6 (six) hours as needed (for pain.).   Yes [provider]  b complex vitamins tablet Take 1 tablet by mouth daily.   Yes [provider]  Cholecalciferol (VITAMIN D) 2000 units tablet Take 2,000 Units by mouth daily.   Yes [provider]  lidocaine-prilocaine (EMLA) cream Apply 1 application topically as needed. Apply small amount to port site at least 1 hour prior to it being accessed, cover with plastic wrap 08/28/17  Yes Earlie Server, MD  memantine (NAMENDA) 10 MG tablet Take 10 mg by mouth 2 (two) times daily.  08/19/16 08/28/18 Yes [provider]  ondansetron (ZOFRAN) 4 MG tablet Take 1 tablet (4 mg total) by mouth every 6 (six) hours as needed for nausea, vomiting or refractory nausea / vomiting. 09/02/17  Yes Earlie Server, MD  prochlorperazine (COMPAZINE) 10 MG tablet Take 1 tablet (10 mg total) by mouth every 6 (six) hours as needed for nausea or vomiting. 09/02/17  Yes Earlie Server, MD  dexamethasone (DECADRON) 4 MG tablet Take 2 tablets (8 mg total) by mouth See admin instructions. Take 2 tablets (8 mg total) by mouth daily. Start the day after chemotherapy for 2 days 09/02/17   Earlie Server, MD     Family History  Problem Relation Age of Onset  . Alzheimer's disease Mother   . Heart failure Father   . Bladder Cancer Neg Hx   . Kidney cancer Neg Hx  Social History   Socioeconomic History  . Marital status: Married    Spouse name: None  . Number of children: None  . Years of education: None  . Highest education level: None  Social Needs  . Financial resource strain: None  . Food insecurity - worry: None  . Food insecurity - inability: None  . Transportation needs - medical: None  . Transportation needs - non-medical: None    Occupational History  . None  Tobacco Use  . Smoking status: Never Smoker  . Smokeless tobacco: Never Used  Substance and Sexual Activity  . Alcohol use: No    Frequency: Never  . Drug use: No  . Sexual activity: None  Other Topics Concern  . None  Social History Narrative  . None    ECOG Status: 2 - Symptomatic, <50% confined to bed  Review of Systems: A 12 point ROS discussed and pertinent positives are indicated in the HPI above.  All other systems are negative.  Review of Systems  Constitutional: Positive for activity change.  HENT: Negative.   Respiratory: Negative.   Cardiovascular: Negative.   Gastrointestinal: Negative.   Genitourinary: Positive for decreased urine volume. Negative for flank pain and hematuria.  Musculoskeletal: Negative.   Neurological: Negative.     Vital Signs: BP (!) 127/59   Pulse 83   Temp 98.3 F (36.8 C) (Oral)   Resp 18   Ht 5\' 5"  (1.651 m)   Wt 129 lb 2 oz (58.6 kg)   SpO2 97%   BMI 21.49 kg/m   Physical Exam  Constitutional: She is oriented to person, place, and time. No distress.  HENT:  Head: Normocephalic and atraumatic.  Cardiovascular: Normal rate, regular rhythm and normal heart sounds. Exam reveals no gallop and no friction rub.  No murmur heard. Pulmonary/Chest: Effort normal and breath sounds normal. No stridor. No respiratory distress. She has no wheezes. She has no rales.  Abdominal: Soft. Bowel sounds are normal. She exhibits no distension. There is no tenderness. There is no rebound and no guarding.  Musculoskeletal: She exhibits no edema.  Neurological: She is alert and oriented to person, place, and time.  Skin: She is not diaphoretic.  Vitals reviewed.   Imaging: Ct Head Wo Contrast  Result Date: 08/18/2017 CLINICAL DATA:  Dizziness x1 month, unsteady gait EXAM: CT HEAD WITHOUT CONTRAST TECHNIQUE: Contiguous axial images were obtained from the base of the skull through the vertex without intravenous  contrast. COMPARISON:  None. FINDINGS: Brain: No evidence of acute infarction, hemorrhage, hydrocephalus, extra-axial collection or mass lesion/mass effect. Mild cortical atrophy. Vascular: Intracranial atherosclerosis. Skull: Normal. Negative for fracture or focal lesion. Sinuses/Orbits: The visualized paranasal sinuses are essentially clear. The mastoid air cells are unopacified. Other: None. IMPRESSION: No evidence of acute intracranial abnormality. Mild cortical atrophy. These results will be called to the ordering clinician or representative by the Radiology Department at the imaging location. Electronically Signed   By: Julian Hy M.D.   On: 08/18/2017 12:42   US Renal  Result Date: 09/08/2017 CLINICAL DATA:  Acute kidney injury. Status post stent. Evaluate hydronephrosis. EXAM: RENAL / URINARY TRACT ULTRASOUND COMPLETE COMPARISON:  PET-CT 08/27/2017 FINDINGS: Right Kidney: Length: 10 cm. Advanced hydronephrosis and hydroureter. Cortical thinning seen down to 8 mm. No evidence of mass or collection. Hydronephrosis and hydroureter is similar to prior PET-CT, new from renal ultrasound 08/17/2017. A ureteral stent is seen to the level of the renal pelvis. Left Kidney: Length: 11 cm. Moderate hydronephrosis. No  evidence of mass or atrophy. A stent is seen to the level of the UPJ. Bladder: Physiologically distended. No inflammatory changes or debris noted. Bilateral ureteral stents. IMPRESSION: 1. Right greater than left hydronephrosis that is similar to 08/27/2017 PET-CT. 2. Bilateral ureteral stents in place. 3. Mild right renal cortical thinning. Electronically Signed   By: Monte Fantasia M.D.   On: 09/08/2017 19:00   US Renal  Result Date: 08/17/2017 CLINICAL DATA:  History bilateral hydronephrosis greater on the right than on the left EXAM: RENAL / URINARY TRACT ULTRASOUND COMPLETE COMPARISON:  Abdominal and pelvic CT scan of August 07, 2017 and retrograde urogram on the right of August 12, 2017. FINDINGS: Right Kidney: Length: 9.0 cm. The renal cortical echotexture is slightly lower than that of the adjacent liver. A ureteral stent is present There is mild cortical thinning. There is no hydronephrosis or perinephric fluid collection. Left Kidney: Length: 10.5 cm. Echogenicity within normal limits. No mass or hydronephrosis visualized. A ureteral stent is present. Bladder: Ureteral stents are present bilaterally. The partially distended urinary bladder is otherwise normal. Incidental note is made of gallstones. IMPRESSION: There has been resolution of the hydronephrosis since stent placement. The right kidney appears smaller than the left. **An incidental finding of potential clinical significance has been found. Multiple gallstones are demonstrated**. Electronically Signed   By: David  Martinique M.D.   On: 08/17/2017 15:34   Nm Pet Image Initial (pi) Skull Base To Thigh  Result Date: 08/27/2017 CLINICAL DATA:  Initial. Treatment strategy for cervical cancer. EXAM: NUCLEAR MEDICINE PET SKULL BASE TO THIGH TECHNIQUE: 13 mCi F-18 FDG was injected intravenously. Full-ring PET imaging was performed from the skull base to thigh after the radiotracer. CT data was obtained and used for attenuation correction and anatomic localization. FASTING BLOOD GLUCOSE:  Value: 94 mg/dl COMPARISON:  None. FINDINGS: NECK: No hypermetabolic lymph nodes in the neck. CHEST: No definite hypermetabolic mediastinal or hilar lymphadenopathy. There is some FDG uptake in the inferior left hilum but no underlying lymph node can be identified. 8 mm short axis right retrocrural lymph node (image 126 series 3) is hypermetabolic with SUV max = 5.1. 10 mm posterior right lower lobe nodule (image 114 series 3) shows low level hypermetabolism with SUV max = 2.5. 9 mm nodule in the deep posterior right costophrenic sulcus (image 121 series 3) is probably hypermetabolic but difficult to assess given the adjacent hepatic uptake.  Biapical pleural-parenchymal scarring is evident in the lungs. No other suspicious hypermetabolic pulmonary nodule or mass is evident. Coronary artery and thoracic aortic atherosclerosis is evident. ABDOMEN/PELVIS: Previously identified retroperitoneal lymphadenopathy is hypermetabolic. 2.1 cm aortocaval lymph node seen on image 155 series 3 demonstrates SUV max = 14.3. There is hypermetabolic lymphadenopathy in both pelvic sidewalls. This is difficult to assess given lack of intravenous contrast material. There is an apparent nodal conglomeration along the right pelvic sidewall measuring 2.0 cm (image 198 series 3) with SUV max = 15.3. It is possible this could represent the patient's ovary as this area is not well demonstrated on this noncontrast exam. Small nodules adjacent to the cervix are hypermetabolic. Index 16 mm nodule just to the right of the cervix on image 208 of series 3 is hypermetabolic. Apparent 11 mm right groin lymph node (image 231) demonstrates hypermetabolism with SUV max = 6.5 The cervix is markedly hypermetabolic and hypermetabolism extends into the uterus. SUV max = 16.9. No abnormal hypermetabolic activity within the liver, pancreas, adrenal glands, or spleen. Persistent bilateral  hydroureteronephrosis although decreased with bilateral internal ureteral stents now visualized in situ. SKELETON: There is a markedly hypermetabolic lesion in the left acetabular roof with SUV max = 6.1. No underlying bone lesion evident on CT imaging. Several other foci of subtle FDG accumulation in the skeleton are identified. Anterior right first rib demonstrates SUV max = 2.8. Posterior right ninth rib demonstrates SUV max = 1.8. Inferior left pubic ramus demonstrates SUV max = 2.6. No underlying lesions are seen at these locations on CT imaging. IMPRESSION: 1. Markedly hypermetabolic cervix and uterus compatible with the patient's reported history of cervical neoplasm. 2. Hypermetabolic right retrocrural,  abdominal retroperitoneal, bilateral pelvic sidewall and right groin lymphadenopathy. There are hypermetabolic soft tissue nodules adjacent to the cervix consistent with metastatic disease. 3. Small right lower lobe pulmonary nodules show low level hypermetabolism, concerning for pulmonary metastatic disease. 4. Hypermetabolic bone lesion left acetabulum without other subtle areas of bony FDG accumulation, concerning for metastatic skeletal involvement. 5. Bilateral hydroureteronephrosis, decreased in the interval with bilateral internal ureteral stents identified in situ. Electronically Signed   By: Misty Stanley M.D.   On: 08/27/2017 14:52   Dg C-arm 1-60 Min-no Report  Result Date: 08/12/2017 Fluoroscopy was utilized by the requesting physician.  No radiographic interpretation.    Labs:  CBC: Recent Labs    08/14/17 0534 09/02/17 0905 09/08/17 1631 09/09/17 0442  WBC 5.4 5.6 8.3 6.5  HGB 10.6* 11.4* 11.1* 9.7*  HCT 31.9* 34.0* 33.2* 28.9*  PLT 231 276 383 318    COAGS: Recent Labs    08/14/17 0534 09/09/17 1115 09/10/17 0447  INR 1.04  --  1.14  APTT  --  35  --     BMP: Recent Labs    09/02/17 0905 09/08/17 1631 09/09/17 0442 09/10/17 0848  NA 138 135 137 136  K 3.6 4.7 4.4 4.2  CL 103 99* 106 110  CO2 24 19* 17* 17*  GLUCOSE 187* 126* 95 93  BUN 27* 91* 90* 93*  CALCIUM 9.1 9.1 8.5* 7.6*  CREATININE 1.61* 5.55* 5.53* 7.15*  GFRNONAA 30* 7* 7* 5*  GFRAA 35* 8* 8* 6*    LIVER FUNCTION TESTS: Recent Labs    09/02/17 0905  BILITOT 0.6  AST 28  ALT 9*  ALKPHOS 63  PROT 7.4  ALBUMIN 4.1    Assessment and Plan:  For bilateral percutaneous nephrostomy tube placement today to treat progressive bilateral renal obstruction.  Risks and benefits of nephrostomy were discussed with the patient including, but not limited to, infection, bleeding, significant bleeding causing loss or decrease in renal function or damage to adjacent structures.   All of the  patient's questions were answered, patient is agreeable to proceed.  Consent signed and in chart.  Thank you for this interesting consult.  I greatly enjoyed meeting Jenna Parrish and look forward to participating in their care.  A copy of this report was sent to the requesting provider on this date.  Electronically Signed: Azzie Roup, MD 09/10/2017, 3:44 PM   I spent a total of 20 Minutes in face to face in clinical consultation, greater than 50% of which was counseling/coordinating care for bilateral nephrostomy tube placement.

## 2017-09-11 ENCOUNTER — Telehealth: Payer: Self-pay | Admitting: Urology

## 2017-09-11 ENCOUNTER — Ambulatory Visit: Payer: Medicare Other

## 2017-09-11 ENCOUNTER — Encounter: Payer: Self-pay | Admitting: Interventional Radiology

## 2017-09-11 DIAGNOSIS — G309 Alzheimer's disease, unspecified: Secondary | ICD-10-CM

## 2017-09-11 DIAGNOSIS — Z515 Encounter for palliative care: Secondary | ICD-10-CM

## 2017-09-11 DIAGNOSIS — T83022A Displacement of nephrostomy catheter, initial encounter: Secondary | ICD-10-CM

## 2017-09-11 DIAGNOSIS — F0280 Dementia in other diseases classified elsewhere without behavioral disturbance: Secondary | ICD-10-CM

## 2017-09-11 LAB — CBC
HEMATOCRIT: 27.5 % — AB (ref 35.0–47.0)
Hemoglobin: 9.4 g/dL — ABNORMAL LOW (ref 12.0–16.0)
MCH: 31.1 pg (ref 26.0–34.0)
MCHC: 34.2 g/dL (ref 32.0–36.0)
MCV: 91 fL (ref 80.0–100.0)
PLATELETS: 370 10*3/uL (ref 150–440)
RBC: 3.03 MIL/uL — ABNORMAL LOW (ref 3.80–5.20)
RDW: 14.5 % (ref 11.5–14.5)
WBC: 6 10*3/uL (ref 3.6–11.0)

## 2017-09-11 LAB — BASIC METABOLIC PANEL
Anion gap: 11 (ref 5–15)
BUN: 87 mg/dL — AB (ref 6–20)
CALCIUM: 8 mg/dL — AB (ref 8.9–10.3)
CO2: 17 mmol/L — AB (ref 22–32)
Chloride: 111 mmol/L (ref 101–111)
Creatinine, Ser: 6.08 mg/dL — ABNORMAL HIGH (ref 0.44–1.00)
GFR calc Af Amer: 7 mL/min — ABNORMAL LOW (ref >60)
GFR, EST NON AFRICAN AMERICAN: 6 mL/min — AB (ref >60)
GLUCOSE: 108 mg/dL — AB (ref 65–99)
POTASSIUM: 4.2 mmol/L (ref 3.5–5.1)
Sodium: 139 mmol/L (ref 135–145)

## 2017-09-11 NOTE — Progress Notes (Signed)
New referral for [facility] services at home received following a Palliative Medicine consult. Please note patient was previously referred to [facility] last week at home, but when seen by Hospice admissions RN patient and family chose to pursue further treatment. She was subsequently admitted to [facility] on 12/11 for treatment of weakness/acute renal failure. She underwent placement of bilateral nephrostomy tubes on 12/13. Palliative Medicine was consulted and met with patient and her husband, they have chosen for her to return home with the support of hospice services. Writer met int he room with patient and her husband to initiate education regarding hospice services, philosophy and team  approach to care with understanding voiced. Discussed DME needs at length, Mr. Reifschneider feels that at this time he does not need a bed or BSC. Patient was alert, and did participate some in the conversation, per chart note review she does have baseline dementia. Hospice information and contact number left with Mr. Magistro. Updated notes faxed to referral. Plan is for discharge home tomorrow, patient will require EMS transport and with signed DNR in place. Flo Shanks RN, BSN, [facility], hospital Liaison [phone] c

## 2017-09-11 NOTE — Progress Notes (Signed)
Per Palliative NP plan is for patient to D/C home with hospice. RN case manager aware of above. Please reconsult if future social work needs arise. CSW signing off.   [employer], LCSW [phone]

## 2017-09-11 NOTE — Progress Notes (Signed)
[facility] at [place] NAME: Jenna Parrish    MR#:  [id number]  DATE OF BIRTH:  [birth year]  SUBJECTIVE:  CHIEF COMPLAINT:   Chief Complaint  Patient presents with  . Weakness  . Dizziness     Came with new diagnosis of cancer, mets, urinary retention and recent ureteral stenting. Decreased appetite, weakness and acute renal failure now. Have minimal urine output. Renal func some worse overnight. Plan for nephrostomy today.  REVIEW OF SYSTEMS:  CONSTITUTIONAL: No fever, positive for fatigue or weakness.  EYES: No blurred or double vision.  EARS, NOSE, AND THROAT: No tinnitus or ear pain.  RESPIRATORY: No cough, shortness of breath, wheezing or hemoptysis.  CARDIOVASCULAR: No chest pain, orthopnea, edema.  GASTROINTESTINAL: No nausea, vomiting, diarrhea or abdominal pain.  GENITOURINARY: No dysuria, hematuria.  ENDOCRINE: No polyuria, nocturia,  HEMATOLOGY: No anemia, easy bruising or bleeding SKIN: No rash or lesion. MUSCULOSKELETAL: No joint pain or arthritis.   NEUROLOGIC: No tingling, numbness, generalized weakness.  PSYCHIATRY: No anxiety or depression.   ROS  DRUG ALLERGIES:   Allergies  Allergen Reactions  . Salicylates Hives  . Aricept [Donepezil Hcl]   . Aspirin Hives  . Codeine Other (See Comments)    Syncope   . Donepezil Other (See Comments)    Sedation  . Galantamine Other (See Comments)    Dizziness    VITALS:  Blood pressure (!) 130/55, pulse 91, temperature 99.4 F (37.4 C), temperature source Oral, resp. rate 16, height 5\' 5"  (1.651 m), weight 58.6 kg (129 lb 2 oz), SpO2 97 %.  PHYSICAL EXAMINATION:  GENERAL:  76 y.o.-year-old thin patient lying in the bed with no acute distress.  EYES: Pupils equal, round, reactive to light and accommodation. No scleral icterus. Extraocular muscles intact.  HEENT: Head atraumatic, normocephalic. Oropharynx and nasopharynx clear.  NECK:  Supple, no jugular venous  distention. No thyroid enlargement, no tenderness.  LUNGS: Normal breath sounds bilaterally, no wheezing, rales,rhonchi or crepitation. No use of accessory muscles of respiration.  CARDIOVASCULAR: S1, S2 normal. No murmurs, rubs, or gallops.  ABDOMEN: Soft, mild tender, nondistended. Bowel sounds present. No organomegaly or mass. Minimal dark urine in foley bag. EXTREMITIES: No pedal edema, cyanosis, or clubbing.  NEUROLOGIC: Cranial nerves II through XII are intact. Muscle strength 3-4/5 in all extremities. Sensation intact. Gait not checked.  PSYCHIATRIC: The patient is alert and oriented x 3.  SKIN: No obvious rash, lesion, or ulcer.   Physical Exam LABORATORY PANEL:   CBC Recent Labs  Lab 09/11/17 0407  WBC 6.0  HGB 9.4*  HCT 27.5*  PLT 370   ------------------------------------------------------------------------------------------------------------------  Chemistries  Recent Labs  Lab 09/11/17 0407  NA 139  K 4.2  CL 111  CO2 17*  GLUCOSE 108*  BUN 87*  CREATININE 6.08*  CALCIUM 8.0*   ------------------------------------------------------------------------------------------------------------------  Cardiac Enzymes No results for input(s): TROPONINI in the last 168 hours. ------------------------------------------------------------------------------------------------------------------  RADIOLOGY:  [place] Intraoperative  Result Date: 09/10/2017 CLINICAL DATA:  Ultrasound was provided for use by the ordering physician, and a technical charge was applied by the performing facility.  No radiologist interpretation/professional services rendered.   [place] Intraoperative  Result Date: 09/10/2017 CLINICAL DATA:  Ultrasound was provided for use by the ordering physician, and a technical charge was applied by the performing facility.  No radiologist interpretation/professional services rendered.    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 acute renal failure could be related to dehydration, and obstruction of her previous stents   IV fluids Urologist consult appreciated, plan for nephrostomy tubes.  2.  Metastatic cervical cancer  oncology to discuss prognosis and further plan. Also asked palliative care team to see Continue dexamethasone   Family decided after meeting everyone- to take her home with palliative care services after the tubes placed.  3.  Poor appetite - stop  Megace due to renal failure.  4.  Dementia committed continue Namenda  5.  Miscellaneous heparin for DVT prophylaxis  6. UTI   Cont rocephin, ur cx sent.   All the records are reviewed and case discussed with Care Management/Social Workerr. Management plans discussed with the patient, family and they are in agreement.  CODE STATUS: Full.  TOTAL TIME TAKING CARE OF THIS PATIENT: 35 minutes.     POSSIBLE D/C IN 1-2 DAYS, DEPENDING ON CLINICAL CONDITION.   Jenna Parrish M.D on 09/11/2017   Between 7am to 6pm - Pager - [phone]  After 6pm go to [email or web address] - password [email or web address] [facility]  Office  [phone]  CC: Primary care physician; Jenna Hector, MD  Note: This dictation was prepared with Dragon dictation along with smaller phrase technology. Any transcriptional errors that result from this process are unintentional.

## 2017-09-11 NOTE — Progress Notes (Signed)
Daily Progress Note   Patient Name: Jenna Parrish       Date: 09/11/2017 DOB: 06/27/1941  Age: 76 y.o. MRN#: [id number] Attending Physician: [name], * Primary Care Physician: Adin Hector, MD Admit Date: 09/08/2017  Reason for Consultation/Follow-up: Establishing goals of care and Hospice Evaluation  Subjective/GOC: Patient awake and alert. Pleasantly confused with baseline dementia. Husband feeding her breakfast. She denies pain or discomfort. Great output this morning from bilateral nephrostomy tubes and with creatinine trending down.   Follow-up with husband at bedside. Updated him on some improvement since tubes were placed. He tells me she "ate more yesterday than in three weeks." We again discussed hospice options. I explained that with her improvement, she is not yet eligible for residential hospice facility. He wishes to take her home with hospice now, again confirming focus on comfort and quality. Neighbor friend, MJ, at bedside who is a Contractor through hospice. MJ speaks of Mr. Ketchem having support from neighbors and church friends who will be available to sit with Hoyle Sauer while he runs errands.   Discussed hospice philosophy with focus on comfort, symptom management, and preventing re-hospitalization. Answered questions and concerns.   Updated daughter Helene Kelp via telephone about plan for home with hospice services.   Length of Stay: 3  Current Medications: Scheduled Meds:  . B-complex with vitamin C  1 tablet Oral Daily  . cholecalciferol  2,000 Units Oral Daily  . dexamethasone  8 mg Oral See admin instructions  . feeding supplement (ENSURE ENLIVE)  237 mL Oral TID BM  . heparin  5,000 Units Subcutaneous Q8H  . lidocaine (PF)  20  mL Intradermal Once  . memantine  10 mg Oral BID  . sodium chloride flush  5 mL Intravenous Q8H    Continuous Infusions: . sodium chloride 100 mL/hr at 09/11/17 0412  . cefTRIAXone (ROCEPHIN) IVPB 2 gram/50 mL D5W (Pyxis) Stopped (09/11/17 0625)    PRN Meds: acetaminophen, HYDROcodone-acetaminophen, ondansetron **OR** ondansetron (ZOFRAN) IV, ondansetron, prochlorperazine  Physical Exam  Constitutional: She appears ill.  Cardiovascular: Regular rhythm.  Pulmonary/Chest: Effort normal.  Abdominal: There is no tenderness.  Genitourinary:  Genitourinary Comments: Bilateral nephrostomy tubes with good output  Neurological: She is alert. She is disoriented.  Skin: Skin is warm and dry. There is pallor.  Psychiatric: Her  speech is delayed.  Nursing note and vitals reviewed.          Vital Signs: BP (!) 113/57 (BP Location: Right Arm)   Pulse 90   Temp 99.4 F (37.4 C) (Oral)   Resp 18   Ht 5\' 5"  (1.651 m)   Wt 58.6 kg (129 lb 2 oz)   SpO2 99%   BMI 21.49 kg/m  SpO2: SpO2: 99 % O2 Device: O2 Device: Not Delivered O2 Flow Rate: O2 Flow Rate (L/min): 2 L/min  Intake/output summary:   Intake/Output Summary (Last 24 hours) at 09/11/2017 9604 Last data filed at 09/11/2017 0900 Gross per 24 hour  Intake 1429.33 ml  Output 1775 ml  Net -345.67 ml   LBM: Last BM Date: 09/08/17 Baseline Weight: Weight: 59 kg (130 lb) Most recent weight: Weight: 58.6 kg (129 lb 2 oz)  Palliative Assessment/Data: PPS 40%   Flowsheet Rows     Most Recent Value  Intake Tab  Referral Department  Hospitalist  Unit at Time of Referral  Oncology Unit  Palliative Care Primary Diagnosis  Cancer  Palliative Care Type  New Palliative care  Date first seen by Palliative Care  09/11/17  Clinical Assessment  Palliative Performance Scale Score  40%  Psychosocial & Spiritual Assessment  Palliative Care Outcomes  Patient/Family meeting held?  Yes  Who was at the meeting?  patient and husband    Palliative Care Outcomes  Clarified goals of care, Provided end of life care assistance, Provided psychosocial or spiritual support, ACP counseling assistance, Counseled regarding hospice      Patient Active Problem List   Diagnosis Date Noted  . Alzheimer's dementia without behavioral disturbance   . Palliative care by specialist   . Metabolic acidosis   . Hydronephrosis   . 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 Acute renal failure (ARF) ([place]) 09/08/2017  . Cervical cancer, FIGO stage IVB ([place]) 09/01/2017  . Dehydration 08/19/2017  . Goals of care, counseling/discussion 08/19/2017  . Acute kidney injury ([place]) 08/13/2017    Palliative Care Assessment & Plan   Patient Profile: 76 y.o. female  with past medical history of Alzheimer's dementia, metastatic cervical cancer, hydronephrosis, and GERD admitted on 09/08/2017 with weakness and dizziness. Recently diagnosed with metastatic cervical cancer. 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 She had bilateral stents placed on 08/12/17 and cervical biopsy that was positive for poorly differentiated carcinoma. PET CT on 08/27/17 revealed metastatic cervical cancer with return of R>L hydronephrosis. In ED, patient with weakness and confusion. Creatinine 5.55 with BUN of 81. Ultrasound confirmed R>L hydronephrosis. Oncology following. Due to performance status decline with underlying dementia, patient is not a candidate for palliative chemotherapy or radiation. Oncology recommending hospice services. Palliative medicine consultation for goals of care.   Assessment: Metastatic cervical cancer R > L hydronephrosis Acute renal failure Alzheimer's dementia  Recommendations/Plan:  DNR/DNI. MOST form completed and in chart.   Nephrostomy tubes for comfort.   Continue current interventions. Plan is home with hospice services. Likely tomorrow. Husband needs time to prepare for her at  home.   Hospice liaison to meet with family today.   Code Status:   DNR/DNI   Code Status Orders  (From admission, onward)        Start     Ordered   09/10/17 1713  Do not attempt resuscitation (DNR)  Continuous    Question Answer Comment  In the event of cardiac or respiratory ARREST  Do not call a "code blue"   In the event of cardiac or respiratory ARREST Do not perform Intubation, CPR, defibrillation or ACLS   In the event of cardiac or respiratory ARREST Use medication by any route, position, wound care, and other measures to relive pain and suffering. May use oxygen, suction and manual treatment of airway obstruction as needed for comfort.      09/10/17 1712    Code Status History    Date Active Date Inactive Code Status Order ID Comments User Context   09/08/2017 22:42 09/10/2017 17:12 Full Code [id number]  Dustin Flock, MD Inpatient   08/13/2017 13:31 08/14/2017 16:06 Full Code [id number]  Loletha Grayer, MD Inpatient    Advance Directive Documentation     Most Recent Value  Type of Advance Directive  Healthcare Power of [place], Living will  Pre-existing out of facility DNR order (yellow form or pink MOST form)  No data  "MOST" Form in Place?  No data       Prognosis:  Likely weeks with metastatic cervical cancer, bilateral hydronephrosis, acute renal failure, underlying dementia, and functional/nutritional/cognitive status decline.  Discharge Planning:  To Be Determined  Care plan was discussed with patient, husband, neighbor, RN, SW, hospice liaison, Dr. Anselm Jungling  Thank you for allowing the Palliative Medicine Team to assist in the care of this patient.   Time In: 0925 Time Out: 1005 Total Time 52min Prolonged Time Billed  no      Greater than 50%  of this time was spent counseling and coordinating care related to the above assessment and plan.  Ihor Dow, FNP-C Palliative Medicine Team  Phone: [phone] Fax: [phone]  Please contact  Palliative Medicine Team phone at [phone] for questions and concerns.

## 2017-09-11 NOTE — Telephone Encounter (Signed)
-----   Message from [name], MD sent at 09/11/2017  8:34 AM EST ----- Regarding: f/u cysto / stent removal next week Please schedule stent removal next week  She is currently an inpatient  If it scheduled before she is discharged, and will show up on her discharge summary

## 2017-09-11 NOTE — Progress Notes (Signed)
Urology Consult Follow Up  Subjective: Status post nephrostomy tube placement bilaterally yesterday.  Excellent urine output overnight liter.  Creatinine trending down this morning.  No complaints.  Foley remains in place.  Anti-infectives: Anti-infectives (From admission, onward)   Start     Dose/Rate Route Frequency Ordered Stop   09/10/17 1400  ceFAZolin (ANCEF) IVPB 2g/100 mL premix  Status:  Discontinued     2 g 200 mL/hr over 30 Minutes Intravenous  Once 09/10/17 1143 09/10/17 1144   09/10/17 1400  ceFAZolin (ANCEF) 2 g in dextrose 5 % 100 mL IVPB     2 g 200 mL/hr over 30 Minutes Intravenous  Once 09/10/17 1144 09/10/17 1837   09/09/17 0600  cefTRIAXone (ROCEPHIN) 2 g in dextrose 5 % 50 mL IVPB     2 g 100 mL/hr over 30 Minutes Intravenous Every 24 hours 09/09/17 0436        Current Facility-Administered Medications  Medication Dose Route Frequency Provider Last Rate Last Dose  . 0.9 %  sodium chloride infusion   Intravenous Continuous Dustin Flock, MD 100 mL/hr at 09/11/17 2595    . acetaminophen (TYLENOL) tablet 1,000 mg  1,000 mg Oral Q6H PRN Dustin Flock, MD      . B-complex with vitamin C tablet 1 tablet  1 tablet Oral Daily Dustin Flock, MD   1 tablet at 09/10/17 0809  . cefTRIAXone (ROCEPHIN) 2 g in dextrose 5 % 50 mL IVPB  2 g Intravenous Q24H Saundra Shelling, MD   Stopped at 09/11/17 [phone]  . cholecalciferol (VITAMIN D) tablet 2,000 Units  2,000 Units Oral Daily Dustin Flock, MD   2,000 Units at 09/10/17 0809  . dexamethasone (DECADRON) tablet 8 mg  8 mg Oral See admin instructions Dustin Flock, MD      . feeding supplement (ENSURE ENLIVE) (ENSURE ENLIVE) liquid 237 mL  237 mL Oral TID BM [name], MD      . heparin injection 5,000 Units  5,000 Units Subcutaneous Q8H Dustin Flock, MD   5,000 Units at 09/11/17 0549  . HYDROcodone-acetaminophen (NORCO/VICODIN) 5-325 MG per tablet 1-2 tablet  1-2 tablet Oral Q4H PRN Dustin Flock, MD   1  tablet at 09/09/17 2317  . lidocaine (PF) (XYLOCAINE) 1 % injection 20 mL  20 mL Intradermal Once Aletta Edouard, MD      . memantine [facility]) tablet 10 mg  10 mg Oral BID Dustin Flock, MD   10 mg at 09/10/17 2116  . ondansetron (ZOFRAN) tablet 4 mg  4 mg Oral Q6H PRN Dustin Flock, MD       Or  . ondansetron [facility]) injection 4 mg  4 mg Intravenous Q6H PRN Dustin Flock, MD      . ondansetron [facility]) tablet 4 mg  4 mg Oral Q6H PRN Dustin Flock, MD      . prochlorperazine (COMPAZINE) tablet 10 mg  10 mg Oral Q6H PRN Dustin Flock, MD      . sodium chloride flush (NS) 0.9 % injection 5 mL  5 mL Intravenous Q8H Aletta Edouard, MD   5 mL at 09/11/17 0057     Objective: Vital signs in last 24 hours: Temp:  [98.3 F (36.8 C)-99.4 F (37.4 C)] 99.4 F (37.4 C) (12/14 0444) Pulse Rate:  [78-93] 91 (12/14 0444) Resp:  [13-28] 16 (12/14 0444) BP: (107-168)/(33-77) 130/55 (12/14 0444) SpO2:  [97 %-99 %] 97 % (12/14 0444)  Intake/Output from previous day: 12/13 0701 - 12/14 0700 In: 1029.3 [I.V.:1019.3] Out:  1975 [ZDGLO:7564] Intake/Output this shift: No intake/output data recorded.   Physical Exam  Alert, attentive, confused. Abdomen soft, nontender, nondistended Left nephrostomy tube draining clear yellow urine.  Right nephrostomy tube draining pink tinged urine with scant clot. Foley catheter in place draining scant urine  Lab Results:  Recent Labs    09/09/17 0442 09/11/17 0407  WBC 6.5 6.0  HGB 9.7* 9.4*  HCT 28.9* 27.5*  PLT 318 370   BMET Recent Labs    09/10/17 0848 09/11/17 0407  NA 136 139  K 4.2 4.2  CL 110 111  CO2 17* 17*  GLUCOSE 93 108*  BUN 93* 87*  CREATININE 7.15* 6.08*  CALCIUM 7.6* 8.0*   PT/INR Recent Labs    09/10/17 0447  LABPROT 14.5  INR 1.14   ABG No results for input(s): PHART, HCO3 in the last 72 hours.  Invalid input(s): PCO2, PO2  Studies/Results: [place] Intraoperative  Result Date: 09/10/2017 CLINICAL DATA:   Ultrasound was provided for use by the ordering physician, and a technical charge was applied by the performing facility.  No radiologist interpretation/professional services rendered.   [place] Intraoperative  Result Date: 09/10/2017 CLINICAL DATA:  Ultrasound was provided for use by the ordering physician, and a technical charge was applied by the performing facility.  No radiologist interpretation/professional services rendered.     Assessment: 76 year old female with metastatic and locally advanced cervical cancer with bilateral ureteral obstruction who failed bilateral ureteral stenting requiring bilateral percutaneous nephrostomy tubes placed yesterday.  Urine output improving and creatinine trending down which is reassuring.  Plan: -d/c foley -Flush nephrostomy tubes per IR and then once at home, as needed -Return to care in 1-2 weeks for cystoscopy, stent removal in the office -Discharge per primary team, urology will sign off    LOS: 3 days    [name] 09/11/2017

## 2017-09-11 NOTE — Care Management Note (Addendum)
Case Management Note  Patient Details  Name: Jenna Parrish MRN: [id number] Date of Birth: 28-Nov-1940  Subjective/Objective:  Admitted to [facility] with the diagnosis of acute renal failure. Lives with husband Gwyndolyn Saxon [phone]). Last seen Dr. Caryl Comes 09/04/17. Prescriptions are filled at [facility] on [address]. No home Health. No skilled facility. No home oxygen. Rolling walker and bedside commode. Self feed, sometimes needs help dressing and baths. Decreased appetite x 1 month. Lost 10 pounds. No falls. Nephrostomy tubes placed 09/10/17. Foley in place                   Action/Plan Will be discharged to home 09/12/17 with [facility]   Expected Discharge Date:                  Expected Discharge Plan:     In-House Referral:   yes  Discharge planning Services     Post Acute Care Choice:    Choice offered to:     DME Arranged:    DME Agency:     HH Arranged:   yes HH Agency:   [facility]  Status of Service:     If discussed at [employer] of Stay Meetings, dates discussed:    Additional Comments:  Shelbie Ammons, RN MSN CCM Care management [phone] 09/11/2017, 9:29 AM

## 2017-09-11 NOTE — Telephone Encounter (Signed)
App made ° ° °michelle °

## 2017-09-11 NOTE — Progress Notes (Signed)
Referring Physician(s): [name]  Supervising Physician: Aletta Edouard  Patient Status:  [facility] - In-pt  Chief Complaint:  Metastatic cervical cancer causing hydronephrosis  HPI:  Jenna Parrish is a 76 y.o. female with advanced cervical carcinoma and bilateral ureteral obstruction now developing severe bilateral hydronephrosis, worsening renal failure and anuria secondary to progressive renal obstruction despite bilateral ureteral stents.    She underwent bilateral nephrostomy tube placements yesterday by Dr. Kathlene Cote.   Subjective:  She is sitting up in bed. Lots of family in the room. No complaints.   Allergies: Salicylates; Aricept [donepezil hcl]; Aspirin; Codeine; Donepezil; and Galantamine  Medications: Prior to Admission medications   Medication Sig Start Date End Date Taking? Authorizing Provider  acetaminophen (TYLENOL) 500 MG tablet Take 1,000 mg by mouth every 6 (six) hours as needed (for pain.).   Yes [provider]  b complex vitamins tablet Take 1 tablet by mouth daily.   Yes [provider]  Cholecalciferol (VITAMIN D) 2000 units tablet Take 2,000 Units by mouth daily.   Yes [provider]  lidocaine-prilocaine (EMLA) cream Apply 1 application topically as needed. Apply small amount to port site at least 1 hour prior to it being accessed, cover with plastic wrap 08/28/17  Yes Earlie Server, MD  memantine (NAMENDA) 10 MG tablet Take 10 mg by mouth 2 (two) times daily.  08/19/16 08/28/18 Yes [provider]  ondansetron (ZOFRAN) 4 MG tablet Take 1 tablet (4 mg total) by mouth every 6 (six) hours as needed for nausea, vomiting or refractory nausea / vomiting. 09/02/17  Yes Earlie Server, MD  prochlorperazine (COMPAZINE) 10 MG tablet Take 1 tablet (10 mg total) by mouth every 6 (six) hours as needed for nausea or vomiting. 09/02/17  Yes Earlie Server, MD  dexamethasone (DECADRON) 4 MG tablet Take 2 tablets (8 mg total) by mouth  See admin instructions. Take 2 tablets (8 mg total) by mouth daily. Start the day after chemotherapy for 2 days 09/02/17   Earlie Server, MD     Vital Signs: BP (!) 113/57 (BP Location: Right Arm)   Pulse 90   Temp 99.4 F (37.4 C) (Oral)   Resp 18   Ht 5\' 5"  (1.651 m)   Wt 129 lb 2 oz (58.6 kg)   SpO2 99%   BMI 21.49 kg/m   Physical Exam Awake and alert NAD Bilateral nephrostomy tubes in place. Left with clear yellow urine, ~ 1200 mL output Right with darker yellow urine, ~425 mL output  Imaging: [place] Intraoperative  Result Date: 09/10/2017 CLINICAL DATA:  Ultrasound was provided for use by the ordering physician, and a technical charge was applied by the performing facility.  No radiologist interpretation/professional services rendered.   [place] Intraoperative  Result Date: 09/10/2017 CLINICAL DATA:  Ultrasound was provided for use by the ordering physician, and a technical charge was applied by the performing facility.  No radiologist interpretation/professional services rendered.   US Renal  Result Date: 09/08/2017 CLINICAL DATA:  Acute kidney injury. Status post stent. Evaluate hydronephrosis. EXAM: RENAL / URINARY TRACT ULTRASOUND COMPLETE COMPARISON:  PET-CT 08/27/2017 FINDINGS: Right Kidney: Length: 10 cm. Advanced hydronephrosis and hydroureter. Cortical thinning seen down to 8 mm. No evidence of mass or collection. Hydronephrosis and hydroureter is similar to prior PET-CT, new from renal ultrasound 08/17/2017. A ureteral stent is seen to the level of the renal pelvis. Left Kidney: Length: 11 cm. Moderate hydronephrosis. No evidence of mass or atrophy. A stent is seen to  the level of the UPJ. Bladder: Physiologically distended. No inflammatory changes or debris noted. Bilateral ureteral stents. IMPRESSION: 1. Right greater than left hydronephrosis that is similar to 08/27/2017 PET-CT. 2. Bilateral ureteral stents in place. 3. Mild right renal cortical thinning. Electronically Signed    By: Monte Fantasia M.D.   On: 09/08/2017 19:00   Ir Nephrostomy Placement Left  Result Date: 09/11/2017 INDICATION: Advanced cervical carcinoma and failed bilateral ureteral stenting with development of severe bilateral hydronephrosis, progressive renal failure and anuria despite the presence of bilateral ureteral stents. The patient has been referred for bilateral percutaneous nephrostomy tube placement. EXAM: IR NEPHROSTOMY PLACEMENT RIGHT; IR NEPHROSTOMY PLACEMENT LEFT COMPARISON:  Renal ultrasound on 09/08/2017 MEDICATIONS: 2 g IV Ancef; The antibiotic was administered in an appropriate time frame prior to skin puncture. ANESTHESIA/SEDATION: Fentanyl 62.5 mcg IV; Versed 1.5 mg IV Moderate Sedation Time:  23 minutes. The patient was continuously monitored during the procedure by the interventional radiology nurse under my direct supervision. CONTRAST:  42mL ISOVUE-300 IOPAMIDOL (ISOVUE-300) INJECTION 61% - administered into the collecting system(s) FLUOROSCOPY TIME:  Fluoroscopy Time: 1 minutes and 18 seconds. 8.0 mGy. COMPLICATIONS: None immediate. PROCEDURE: Informed written consent was obtained from the patient after a thorough discussion of the procedural risks, benefits and alternatives. All questions were addressed. Maximal Sterile Barrier Technique was utilized including caps, mask, sterile gowns, sterile gloves, sterile drape, hand hygiene and skin antiseptic. A timeout was performed prior to the initiation of the procedure. Ultrasound was used to localize both kidneys. Under ultrasound guidance, identical procedure was performed for both kidneys with initial puncture of the collecting systems with a 21 gauge needle. After return of urine, diluted contrast material was injected into the collecting systems. Guidewires were advanced. Transitional dilators were placed. The percutaneous tracts were then dilated over standard guidewires. Bilateral 10 French percutaneous nephrostomy tubes were placed  over guidewires. Both were formed at the level of the renal pelvis bilaterally. Each catheter was injected with contrast material and fluoroscopic spot images obtained to confirm tube position. The catheters were connected to gravity drainage bags. Both catheters were secured at the skin with Prolene retention sutures and StatLock devices. FINDINGS: Ultrasound shows the presence of severe hydronephrosis bilaterally, right greater than left. Bilateral 10 French nephrostomy tubes were placed and formed at the level of the renal pelvis bilaterally. There is good return of urine from both nephrostomy tubes. IMPRESSION: Bilateral percutaneous nephrostomy tube placement. Ten French nephrostomy tubes were placed bilaterally and formed at the level of the renal pelvis. Both were left to gravity bag drainage. Electronically Signed   By: Aletta Edouard M.D.   On: 09/11/2017 09:34   Ir Nephrostomy Placement Right  Result Date: 09/11/2017 INDICATION: Advanced cervical carcinoma and failed bilateral ureteral stenting with development of severe bilateral hydronephrosis, progressive renal failure and anuria despite the presence of bilateral ureteral stents. The patient has been referred for bilateral percutaneous nephrostomy tube placement. EXAM: IR NEPHROSTOMY PLACEMENT RIGHT; IR NEPHROSTOMY PLACEMENT LEFT COMPARISON:  Renal ultrasound on 09/08/2017 MEDICATIONS: 2 g IV Ancef; The antibiotic was administered in an appropriate time frame prior to skin puncture. ANESTHESIA/SEDATION: Fentanyl 62.5 mcg IV; Versed 1.5 mg IV Moderate Sedation Time:  23 minutes. The patient was continuously monitored during the procedure by the interventional radiology nurse under my direct supervision. CONTRAST:  63mL ISOVUE-300 IOPAMIDOL (ISOVUE-300) INJECTION 61% - administered into the collecting system(s) FLUOROSCOPY TIME:  Fluoroscopy Time: 1 minutes and 18 seconds. 8.0 mGy. COMPLICATIONS: None immediate. PROCEDURE: Informed written  consent  was obtained from the patient after a thorough discussion of the procedural risks, benefits and alternatives. All questions were addressed. Maximal Sterile Barrier Technique was utilized including caps, mask, sterile gowns, sterile gloves, sterile drape, hand hygiene and skin antiseptic. A timeout was performed prior to the initiation of the procedure. Ultrasound was used to localize both kidneys. Under ultrasound guidance, identical procedure was performed for both kidneys with initial puncture of the collecting systems with a 21 gauge needle. After return of urine, diluted contrast material was injected into the collecting systems. Guidewires were advanced. Transitional dilators were placed. The percutaneous tracts were then dilated over standard guidewires. Bilateral 10 French percutaneous nephrostomy tubes were placed over guidewires. Both were formed at the level of the renal pelvis bilaterally. Each catheter was injected with contrast material and fluoroscopic spot images obtained to confirm tube position. The catheters were connected to gravity drainage bags. Both catheters were secured at the skin with Prolene retention sutures and StatLock devices. FINDINGS: Ultrasound shows the presence of severe hydronephrosis bilaterally, right greater than left. Bilateral 10 French nephrostomy tubes were placed and formed at the level of the renal pelvis bilaterally. There is good return of urine from both nephrostomy tubes. IMPRESSION: Bilateral percutaneous nephrostomy tube placement. Ten French nephrostomy tubes were placed bilaterally and formed at the level of the renal pelvis. Both were left to gravity bag drainage. Electronically Signed   By: Aletta Edouard M.D.   On: 09/11/2017 09:34    Labs:  CBC: Recent Labs    09/02/17 0905 09/08/17 1631 09/09/17 0442 09/11/17 0407  WBC 5.6 8.3 6.5 6.0  HGB 11.4* 11.1* 9.7* 9.4*  HCT 34.0* 33.2* 28.9* 27.5*  PLT 276 383 318 370    COAGS: Recent Labs     08/14/17 0534 09/09/17 1115 09/10/17 0447  INR 1.04  --  1.14  APTT  --  35  --     BMP: Recent Labs    09/08/17 1631 09/09/17 0442 09/10/17 0848 09/11/17 0407  NA 135 137 136 139  K 4.7 4.4 4.2 4.2  CL 99* 106 110 111  CO2 19* 17* 17* 17*  GLUCOSE 126* 95 93 108*  BUN 91* 90* 93* 87*  CALCIUM 9.1 8.5* 7.6* 8.0*  CREATININE 5.55* 5.53* 7.15* 6.08*  GFRNONAA 7* 7* 5* 6*  GFRAA 8* 8* 6* 7*    LIVER FUNCTION TESTS: Recent Labs    09/02/17 0905  BILITOT 0.6  AST 28  ALT 9*  ALKPHOS 63  PROT 7.4  ALBUMIN 4.1    Assessment and Plan:  Advanced cervical carcinoma and bilateral ureteral obstruction with severe bilateral hydronephrosis, worsening renal failure and anuria secondary to progressive renal obstruction despite bilateral ureteral stents.    S/P bilateral nephrostomy tube placements on 09/10/2017 by Dr. Kathlene Cote.  Palliative/Hospice consulted.  Continue routine PCN care.  Electronically Signed: Murrell Redden, PA-C 09/11/2017, 10:10 AM   I spent a total of 15 Minutes at the the patient's bedside AND on the patient's hospital floor or unit, greater than 50% of which was counseling/coordinating care for f/u [facility]

## 2017-09-11 NOTE — Progress Notes (Signed)
Clinical Education officer, museum (CSW) received a residential hospice consult. CSW attempted to contact patient's daughter Helene Kelp however she did not answer and a voicemail was left. CSW contacted patient's husband Taleeyah Bora and provided hospice facility choice and he chose [facility]. Referral was given to [facility] liaison. CSW will continue to follow and assist as needed.   [employer], LCSW [phone]

## 2017-09-11 NOTE — Care Management Important Message (Signed)
Important Message  Patient Details  Name: Jenna Parrish MRN: [id number] Date of Birth: 03-12-1941   Medicare Important Message Given:  Yes    Shelbie Ammons, RN 09/11/2017, 7:18 AM

## 2017-09-12 LAB — BASIC METABOLIC PANEL
Anion gap: 11 (ref 5–15)
BUN: 53 mg/dL — ABNORMAL HIGH (ref 6–20)
CHLORIDE: 112 mmol/L — AB (ref 101–111)
CO2: 16 mmol/L — ABNORMAL LOW (ref 22–32)
Calcium: 7.7 mg/dL — ABNORMAL LOW (ref 8.9–10.3)
Creatinine, Ser: 3.26 mg/dL — ABNORMAL HIGH (ref 0.44–1.00)
GFR calc non Af Amer: 13 mL/min — ABNORMAL LOW (ref >60)
GFR, EST AFRICAN AMERICAN: 15 mL/min — AB (ref >60)
Glucose, Bld: 95 mg/dL (ref 65–99)
POTASSIUM: 3.8 mmol/L (ref 3.5–5.1)
SODIUM: 139 mmol/L (ref 135–145)

## 2017-09-12 LAB — CBC
HEMATOCRIT: 26.4 % — AB (ref 35.0–47.0)
Hemoglobin: 8.9 g/dL — ABNORMAL LOW (ref 12.0–16.0)
MCH: 30.7 pg (ref 26.0–34.0)
MCHC: 33.8 g/dL (ref 32.0–36.0)
MCV: 90.6 fL (ref 80.0–100.0)
Platelets: 352 10*3/uL (ref 150–440)
RBC: 2.91 MIL/uL — AB (ref 3.80–5.20)
RDW: 14.7 % — ABNORMAL HIGH (ref 11.5–14.5)
WBC: 5.8 10*3/uL (ref 3.6–11.0)

## 2017-09-12 MED ORDER — HEPARIN SOD (PORK) LOCK FLUSH 100 UNIT/ML IV SOLN
500.0000 [IU] | Freq: Once | INTRAVENOUS | Status: AC
  Administered 2017-09-12: 15:00:00 500 [IU] via INTRAVENOUS
  Filled 2017-09-12: qty 5

## 2017-09-12 MED ORDER — HYDROCODONE-ACETAMINOPHEN 5-325 MG PO TABS: 1.0000 | ORAL_TABLET | ORAL | 0 refills | Status: AC | PRN

## 2017-09-12 NOTE — Progress Notes (Signed)
[place] [facility] [place] NAME: Jenna Parrish    MR#:  [id number]  DATE OF BIRTH:  09/12/1941  SUBJECTIVE:  CHIEF COMPLAINT:   Chief Complaint  Patient presents with  . Weakness  . Dizziness     Came with new diagnosis of cancer, mets, urinary retention and recent ureteral stenting. Decreased appetite, weakness and acute renal failure now. Have minimal urine output. Renal func some improved after nephrostomy tubes placed.  REVIEW OF SYSTEMS:  CONSTITUTIONAL: No fever, positive for fatigue or weakness.  EYES: No blurred or double vision.  EARS, NOSE, AND THROAT: No tinnitus or ear pain.  RESPIRATORY: No cough, shortness of breath, wheezing or hemoptysis.  CARDIOVASCULAR: No chest pain, orthopnea, edema.  GASTROINTESTINAL: No nausea, vomiting, diarrhea or abdominal pain.  GENITOURINARY: No dysuria, hematuria.  ENDOCRINE: No polyuria, nocturia,  HEMATOLOGY: No anemia, easy bruising or bleeding SKIN: No rash or lesion. MUSCULOSKELETAL: No joint pain or arthritis.   NEUROLOGIC: No tingling, numbness, generalized weakness.  PSYCHIATRY: No anxiety or depression.   ROS  DRUG ALLERGIES:   Allergies  Allergen Reactions  . Salicylates Hives  . Aricept [Donepezil Hcl]   . Aspirin Hives  . Codeine Other (See Comments)    Syncope   . Donepezil Other (See Comments)    Sedation  . Galantamine Other (See Comments)    Dizziness    VITALS:  Blood pressure 112/65, pulse 91, temperature 98.8 F (37.1 C), temperature source Oral, resp. rate 20, height 5\' 5"  (1.651 m), weight 58.6 kg (129 lb 2 oz), SpO2 98 %.  PHYSICAL EXAMINATION:  GENERAL:  76 y.o.-year-old thin patient lying in the bed with no acute distress.  EYES: Pupils equal, round, reactive to light and accommodation. No scleral icterus. Extraocular muscles intact.  HEENT: Head atraumatic, normocephalic. Oropharynx and nasopharynx clear.  NECK:  Supple, no jugular venous distention. No  thyroid enlargement, no tenderness.  LUNGS: Normal breath sounds bilaterally, no wheezing, rales,rhonchi or crepitation. No use of accessory muscles of respiration.  CARDIOVASCULAR: S1, S2 normal. No murmurs, rubs, or gallops.  ABDOMEN: Soft, mild tender, nondistended. Bowel sounds present. No organomegaly or mass. Nephrostomy tubes in place. EXTREMITIES: No pedal edema, cyanosis, or clubbing.  NEUROLOGIC: Cranial nerves II through XII are intact. Muscle strength 3-4/5 in all extremities. Sensation intact. Gait not checked.  PSYCHIATRIC: The patient is alert and oriented x 3.  SKIN: No obvious rash, lesion, or ulcer.   Physical Exam LABORATORY PANEL:   CBC Recent Labs  Lab 09/11/17 0407  WBC 6.0  HGB 9.4*  HCT 27.5*  PLT 370   ------------------------------------------------------------------------------------------------------------------  Chemistries  Recent Labs  Lab 09/11/17 0407  NA 139  K 4.2  CL 111  CO2 17*  GLUCOSE 108*  BUN 87*  CREATININE 6.08*  CALCIUM 8.0*   ------------------------------------------------------------------------------------------------------------------  Cardiac Enzymes No results for input(s): TROPONINI in the last 168 hours. ------------------------------------------------------------------------------------------------------------------  RADIOLOGY:  [place] Intraoperative  Result Date: 09/10/2017 CLINICAL DATA:  Ultrasound was provided for use by the ordering physician, and a technical charge was applied by the performing facility.  No radiologist interpretation/professional services rendered.   [place] Intraoperative  Result Date: 09/10/2017 CLINICAL DATA:  Ultrasound was provided for use by the ordering physician, and a technical charge was applied by the performing facility.  No radiologist interpretation/professional services rendered.   Ir Nephrostomy Placement Left  Result Date: 09/11/2017 INDICATION: Advanced cervical carcinoma  and failed bilateral ureteral stenting with development of severe bilateral hydronephrosis,  progressive renal failure and anuria despite the presence of bilateral ureteral stents. The patient has been referred for bilateral percutaneous nephrostomy tube placement. EXAM: IR NEPHROSTOMY PLACEMENT RIGHT; IR NEPHROSTOMY PLACEMENT LEFT COMPARISON:  Renal ultrasound on 09/08/2017 MEDICATIONS: 2 g IV Ancef; The antibiotic was administered in an appropriate time frame prior to skin puncture. ANESTHESIA/SEDATION: Fentanyl 62.5 mcg IV; Versed 1.5 mg IV Moderate Sedation Time:  23 minutes. The patient was continuously monitored during the procedure by the interventional radiology nurse under my direct supervision. CONTRAST:  2mL ISOVUE-300 IOPAMIDOL (ISOVUE-300) INJECTION 61% - administered into the collecting system(s) FLUOROSCOPY TIME:  Fluoroscopy Time: 1 minutes and 18 seconds. 8.0 mGy. COMPLICATIONS: None immediate. PROCEDURE: Informed written consent was obtained from the patient after a thorough discussion of the procedural risks, benefits and alternatives. All questions were addressed. Maximal Sterile Barrier Technique was utilized including caps, mask, sterile gowns, sterile gloves, sterile drape, hand hygiene and skin antiseptic. A timeout was performed prior to the initiation of the procedure. Ultrasound was used to localize both kidneys. Under ultrasound guidance, identical procedure was performed for both kidneys with initial puncture of the collecting systems with a 21 gauge needle. After return of urine, diluted contrast material was injected into the collecting systems. Guidewires were advanced. Transitional dilators were placed. The percutaneous tracts were then dilated over standard guidewires. Bilateral 10 French percutaneous nephrostomy tubes were placed over guidewires. Both were formed at the level of the renal pelvis bilaterally. Each catheter was injected with contrast material and fluoroscopic spot  images obtained to confirm tube position. The catheters were connected to gravity drainage bags. Both catheters were secured at the skin with Prolene retention sutures and StatLock devices. FINDINGS: Ultrasound shows the presence of severe hydronephrosis bilaterally, right greater than left. Bilateral 10 French nephrostomy tubes were placed and formed at the level of the renal pelvis bilaterally. There is good return of urine from both nephrostomy tubes. IMPRESSION: Bilateral percutaneous nephrostomy tube placement. Ten French nephrostomy tubes were placed bilaterally and formed at the level of the renal pelvis. Both were left to gravity bag drainage. Electronically Signed   By: Aletta Edouard M.D.   On: 09/11/2017 09:34   Ir Nephrostomy Placement Right  Result Date: 09/11/2017 INDICATION: Advanced cervical carcinoma and failed bilateral ureteral stenting with development of severe bilateral hydronephrosis, progressive renal failure and anuria despite the presence of bilateral ureteral stents. The patient has been referred for bilateral percutaneous nephrostomy tube placement. EXAM: IR NEPHROSTOMY PLACEMENT RIGHT; IR NEPHROSTOMY PLACEMENT LEFT COMPARISON:  Renal ultrasound on 09/08/2017 MEDICATIONS: 2 g IV Ancef; The antibiotic was administered in an appropriate time frame prior to skin puncture. ANESTHESIA/SEDATION: Fentanyl 62.5 mcg IV; Versed 1.5 mg IV Moderate Sedation Time:  23 minutes. The patient was continuously monitored during the procedure by the interventional radiology nurse under my direct supervision. CONTRAST:  12mL ISOVUE-300 IOPAMIDOL (ISOVUE-300) INJECTION 61% - administered into the collecting system(s) FLUOROSCOPY TIME:  Fluoroscopy Time: 1 minutes and 18 seconds. 8.0 mGy. COMPLICATIONS: None immediate. PROCEDURE: Informed written consent was obtained from the patient after a thorough discussion of the procedural risks, benefits and alternatives. All questions were addressed. Maximal  Sterile Barrier Technique was utilized including caps, mask, sterile gowns, sterile gloves, sterile drape, hand hygiene and skin antiseptic. A timeout was performed prior to the initiation of the procedure. Ultrasound was used to localize both kidneys. Under ultrasound guidance, identical procedure was performed for both kidneys with initial puncture of the collecting systems with a 21 gauge needle.  After return of urine, diluted contrast material was injected into the collecting systems. Guidewires were advanced. Transitional dilators were placed. The percutaneous tracts were then dilated over standard guidewires. Bilateral 10 French percutaneous nephrostomy tubes were placed over guidewires. Both were formed at the level of the renal pelvis bilaterally. Each catheter was injected with contrast material and fluoroscopic spot images obtained to confirm tube position. The catheters were connected to gravity drainage bags. Both catheters were secured at the skin with Prolene retention sutures and StatLock devices. FINDINGS: Ultrasound shows the presence of severe hydronephrosis bilaterally, right greater than left. Bilateral 10 French nephrostomy tubes were placed and formed at the level of the renal pelvis bilaterally. There is good return of urine from both nephrostomy tubes. IMPRESSION: Bilateral percutaneous nephrostomy tube placement. Ten French nephrostomy tubes were placed bilaterally and formed at the level of the renal pelvis. Both were left to gravity bag drainage. Electronically Signed   By: Aletta Edouard M.D.   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 acute renal failure could be related to dehydration, and obstruction of her previous stents   IV fluids Urologist consult appreciated, s/p nephrostomy tubes.  2.  Metastatic cervical cancer  oncology has discussed prognosis and further plan. Also had palliative care consult Continue dexamethasone   Family decided after meeting everyone- to take her home with hospice care services - making arrangements today at home and d/c tomorrow.  3.  Poor appetite - stop  Megace due to renal failure.  4.  Dementia committed continue Namenda  5.  Miscellaneous heparin for DVT prophylaxis  6. UTI- suspected but ruled out.    Given rocephin on admission, but c is negative, no WBCs.   Likely high WBCs in UA is due to obstruction.    Stop IV Abx.   All the records are reviewed and case discussed with Care Management/Social Workerr. Management plans discussed with the patient, family and they are in agreement.  CODE STATUS: Full.  TOTAL TIME TAKING CARE OF THIS PATIENT: 30 minutes.   POSSIBLE D/C IN 1-2 DAYS, DEPENDING ON CLINICAL CONDITION.   Jenna Parrish M.D on 09/12/2017   Between 7am to 6pm - Pager - [phone]  After 6pm go to [email or web address] - password [email or web address] [place] [facility]  Office  [phone]  CC: Primary care physician; Jenna Hector, MD  Note: This dictation was prepared with Dragon dictation along with smaller phrase technology. Any transcriptional errors that result from this process are unintentional.

## 2017-09-12 NOTE — Progress Notes (Signed)
Dc instructions gone over with pt's husband.  Understands medications and follow up appt.  EMS has been called.  Will give pt a pain pill around 1500 per husbands request.

## 2017-09-12 NOTE — Care Management (Addendum)
CM asked to speak with husband as he may be having second thoughts regarding the discharge plan.  He verbalizes concern over his ability to meet all of physical care needs. He states that yesterday they told me right now she is not bad enough to go to the hospice home.  CM discussed hospice staff would not be able to provide round the clock care in the home and he verbalizes understanding.  He says he does not have the money to pay out of pocket for "private duty" at home.  He says he has help from family  right now and he is suppose to call hospice when he gets home today so they can see her.  He wants to be able to keep patient at home as long as possible because that is what she wants.  CM discussed that if patient declines, hospice staff will assist with plan revision.  Updated on call hospice regarding husband's concerns.  Also relayed concern of how patient will travel to urology office next week for attention to nephrostomy tubes. Updated primary nurse and reminded to make sure patient's husband has all of the prescriptions in hand.  Patient to travel by ems. DC summary faxed to [facility]

## 2017-09-12 NOTE — Progress Notes (Signed)
Report to [facility].  Still waiting for EMS.  Pt's husband would like [place] to give pt one more pain pill on EMS arrival.  He will give her tylenol at home tonight if needed.  Will get RX filled tomorrow. Husband is aggravated with the very long wait

## 2017-09-12 NOTE — Plan of Care (Signed)
  Education: Knowledge of General Education information will improve 09/12/2017 0454 - Progressing by Jeffie Pollock, RN   Clinical Measurements: Ability to maintain clinical measurements within normal limits will improve 09/12/2017 0454 - Progressing by Jeffie Pollock, RN Will remain free from infection 09/12/2017 0454 - Progressing by Jeffie Pollock, RN Diagnostic test results will improve 09/12/2017 0454 - Progressing by Jeffie Pollock, RN Respiratory complications will improve 09/12/2017 0454 - Progressing by Jeffie Pollock, RN Cardiovascular complication will be avoided 09/12/2017 0454 - Progressing by Jeffie Pollock, RN   Safety: Ability to remain free from injury will improve 09/12/2017 0454 - Progressing by Jeffie Pollock, RN   Activity: Ability to implement measures to reduce episodes of fatigue will improve 09/12/2017 0454 - Progressing by Jeffie Pollock, RN   Bowel/Gastric: Will not experience complications related to bowel motility 09/12/2017 0454 - Progressing by Jeffie Pollock, RN   Coping: Ability to identify and develop effective coping behavior will improve 09/12/2017 0454 - Progressing by Jeffie Pollock, RN   Nutritional: Maintenance of adequate nutrition will improve 09/12/2017 0454 - Progressing by Jeffie Pollock, RN

## 2017-09-12 NOTE — Progress Notes (Signed)
[facility]) called EMS d/t pt's extended wait, per EMS pt is next on list and should be picked up in the next 30 mins. Alecia  [facility]) in to speak with patient's family.

## 2017-09-12 NOTE — Discharge Summary (Signed)
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 Abnormal Pap smear of cervix   . Alzheimer's dementia   . Breast cancer ([place])    left lumpectomy with node removal  . Cervical cancer ([place])   . 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 Dehydration 08/19/2017  . Dementia   . GERD (gastroesophageal reflux disease)   . Goals of care, counseling/discussion 08/19/2017  . Hydronephrosis   . Pelvic mass     HOSPITAL COURSE:   76 year old female with past medical history of metastatic cervical cancer, dementia, GERD, history of breast cancer who presented to the hospital due to generalized weakness and noted to be in acute renal failure.  1. Acute renal failure secondary to obstruction from her metastatic cervical cancer. -Patient is status post bilateral nephrostomy tube placement. This was for comfort. Renal function is currently stable.  2. Metastatic cervical cancer-seen by oncology and also palliative care. Patient does not want to pursue any further aggressive care and therefore is currently under hospice services. She is being discharged home with hospice services. -Continue when necessary Vicodin for  pain.  3. Dementia-continue Namenda.  4. Failure to thrive/poor appetite-secondary to underlying malignancy and dementia. -Continue nutritional supplements.  5. Suspected urinary tract infection-this has not been ruled out. Currently afebrile, hemodynamically stable. Urine cultures have been negative. Patient was given IV antibiotics while in the hospital but currently is not being discharged on any antibiotics.  DISCHARGE CONDITIONS:   Stable  CONSULTS OBTAINED:  Treatment Team:  Earlie Server, MD Festus Aloe, MD  DRUG ALLERGIES:   Allergies  Allergen Reactions  . Salicylates Hives  . Aricept [Donepezil Hcl]   . Aspirin Hives  . Codeine Other (See Comments)    Syncope   . Donepezil Other (See Comments)    Sedation  . Galantamine Other (See Comments)    Dizziness    DISCHARGE MEDICATIONS:   Allergies as of 09/12/2017      Reactions   Salicylates Hives   Aricept [donepezil Hcl]    Aspirin Hives   Codeine Other (See Comments)   Syncope    Donepezil Other (See Comments)   Sedation   Galantamine Other (See Comments)   Dizziness      Medication List    STOP taking these medications   dexamethasone 4 MG tablet Commonly known as:  DECADRON     TAKE these medications   acetaminophen 500 MG tablet Commonly known as:  TYLENOL Take 1,000 mg by mouth every 6 (six) hours as needed (for pain.).   b complex vitamins tablet Take 1 tablet by mouth daily.   HYDROcodone-acetaminophen 5-325 MG tablet Commonly known as:  NORCO/VICODIN Take 1-2 tablets by mouth every 4 (four) hours as needed for moderate  pain.   lidocaine-prilocaine cream Commonly known as:  EMLA Apply 1 application topically as needed. Apply small amount to port site at least 1 hour prior to it being accessed, cover with plastic wrap   memantine 10 MG tablet Commonly known as:  NAMENDA Take 10 mg by mouth 2 (two) times daily.   ondansetron 4 MG tablet Commonly known as:  ZOFRAN Take 1 tablet  (4 mg total) by mouth every 6 (six) hours as needed for nausea, vomiting or refractory nausea / vomiting.   prochlorperazine 10 MG tablet Commonly known as:  COMPAZINE Take 1 tablet (10 mg total) by mouth every 6 (six) hours as needed for nausea or vomiting.   Vitamin D 2000 units tablet Take 2,000 Units by mouth daily.         DISCHARGE INSTRUCTIONS:   DIET:  Regular diet  DISCHARGE CONDITION:  Stable  ACTIVITY:  Activity as tolerated  OXYGEN:  Home Oxygen: No.   Oxygen Delivery: room air  DISCHARGE LOCATION:  Home with Hospice.    If you experience worsening of your admission symptoms, develop shortness of breath, life threatening emergency, suicidal or homicidal thoughts you must seek medical attention immediately by calling 911 or calling your MD immediately  if symptoms less severe.  You Must read complete instructions/literature along with all the possible adverse reactions/side effects for all the Medicines you take and that have been prescribed to you. Take any new Medicines after you have completely understood and accpet all the possible adverse reactions/side effects.   Please note  You were cared for by a hospitalist during your hospital stay. If you have any questions about your discharge medications or the care you received while you were in the hospital after you are discharged, you can call the unit and asked to speak with the hospitalist on call if the hospitalist that took care of you is not available. Once you are discharged, your primary care physician will handle any further medical issues. Please note that NO REFILLS for any discharge medications will be authorized once you are discharged, as it is imperative that you return to your primary care physician (or establish a relationship with a primary care physician if you do not have one) for your aftercare needs so that they can reassess your need for medications and monitor your lab values.     Today    No acute events overnight. S/p B/l nephrostomy tubes with yellow urine draining. Patient denies any pain but does not appear to be any distress.  VITAL SIGNS:  Blood pressure (!) 142/58, pulse 91, temperature 98.4 F (36.9 C), temperature source Oral, resp. rate 20, height 5\' 5"  (1.651 m), weight 58.6 kg (129 lb 2 oz), SpO2 100 %.  I/O:    Intake/Output Summary (Last 24 hours) at 09/12/2017 1307 Last data filed at 09/12/2017 0800 Gross per 24 hour  Intake 2423.33 ml  Output 1250 ml  Net 1173.33 ml    PHYSICAL EXAMINATION:  GENERAL:  76 y.o.-year-old patient lying in the bed in no acute distress.  EYES: Pupils equal, round, reactive to light and accommodation. No scleral icterus. Extraocular muscles intact.  HEENT: Head atraumatic, normocephalic. Oropharynx and nasopharynx clear.  NECK:  Supple, no jugular venous distention. No thyroid enlargement, no tenderness.  LUNGS: Normal breath sounds bilaterally, no wheezing, rales,rhonchi. No use of accessory muscles of respiration.  CARDIOVASCULAR: S1, S2 normal. No murmurs, rubs, or gallops.  ABDOMEN: Soft, non-tender, non-distended. Bowel sounds present. No organomegaly or mass.  Bilateral nephrostomy tubes in place, With yellow urine draining. EXTREMITIES: No pedal edema, cyanosis, or clubbing.  NEUROLOGIC: Cranial nerves II through XII are intact. No focal motor or sensory defecits b/l.  Globally weak.  PSYCHIATRIC: The patient is alert and oriented x 3.  SKIN: No obvious rash, lesion, or ulcer.   DATA REVIEW:   CBC Recent Labs  Lab 09/12/17 0459  WBC 5.8  HGB 8.9*  HCT 26.4*  PLT 352    Chemistries  Recent Labs  Lab 09/12/17 0459  NA 139  K 3.8  CL 112*  CO2 16*  GLUCOSE 95  BUN 53*  CREATININE 3.26*  CALCIUM 7.7*    Cardiac Enzymes No results for input(s): TROPONINI in the last 168 hours.  Microbiology Results  Results for orders placed or performed during the hospital encounter of 09/08/17  Urine Culture      Status: None   Collection Time: 09/09/17  1:50 AM  Result Value Ref Range Status   Specimen Description URINE, RANDOM  Final   Special Requests NONE  Final   Culture   Final    NO GROWTH Performed at [facility], [place] [address], [place], [place] [address]    Report Status 09/10/2017 FINAL  Final    RADIOLOGY:  [place] Intraoperative  Result Date: 09/10/2017 CLINICAL DATA:  Ultrasound was provided for use by the ordering physician, and a technical charge was applied by the performing facility.  No radiologist interpretation/professional services rendered.   [place] Intraoperative  Result Date: 09/10/2017 CLINICAL DATA:  Ultrasound was provided for use by the ordering physician, and a technical charge was applied by the performing facility.  No radiologist interpretation/professional services rendered.   Ir Nephrostomy Placement Left  Result Date: 09/11/2017 INDICATION: Advanced cervical carcinoma and failed bilateral ureteral stenting with development of severe bilateral hydronephrosis, progressive renal failure and anuria despite the presence of bilateral ureteral stents. The patient has been referred for bilateral percutaneous nephrostomy tube placement. EXAM: IR NEPHROSTOMY PLACEMENT RIGHT; IR NEPHROSTOMY PLACEMENT LEFT COMPARISON:  Renal ultrasound on 09/08/2017 MEDICATIONS: 2 g IV Ancef; The antibiotic was administered in an appropriate time frame prior to skin puncture. ANESTHESIA/SEDATION: Fentanyl 62.5 mcg IV; Versed 1.5 mg IV Moderate Sedation Time:  23 minutes. The patient was continuously monitored during the procedure by the interventional radiology nurse under my direct supervision. CONTRAST:  40mL ISOVUE-300 IOPAMIDOL (ISOVUE-300) INJECTION 61% - administered into the collecting system(s) FLUOROSCOPY TIME:  Fluoroscopy Time: 1 minutes and 18 seconds. 8.0 mGy. COMPLICATIONS: None immediate. PROCEDURE: Informed written consent was obtained from the patient after a thorough  discussion of the procedural risks, benefits and alternatives. All questions were addressed. Maximal Sterile Barrier Technique was utilized including caps, mask, sterile gowns, sterile gloves, sterile drape, hand hygiene and skin antiseptic. A timeout was performed prior to the initiation of the procedure. Ultrasound was used to localize both kidneys. Under ultrasound guidance, identical procedure was performed for both kidneys with initial puncture of the collecting systems with a 21 gauge needle. After return of urine, diluted contrast material was injected into the collecting systems. Guidewires were advanced. Transitional dilators were placed. The percutaneous tracts were then dilated over standard guidewires. Bilateral 10 French percutaneous nephrostomy tubes were placed over guidewires. Both were formed at the level of the renal pelvis bilaterally. Each catheter was injected with contrast material and fluoroscopic spot images obtained to confirm tube position. The catheters were connected to gravity drainage bags. Both catheters were secured at the skin with  Prolene retention sutures and StatLock devices. FINDINGS: Ultrasound shows the presence of severe hydronephrosis bilaterally, right greater than left. Bilateral 10 French nephrostomy tubes were placed and formed at the level of the renal pelvis bilaterally. There is good return of urine from both nephrostomy tubes. IMPRESSION: Bilateral percutaneous nephrostomy tube placement. Ten French nephrostomy tubes were placed bilaterally and formed at the level of the renal pelvis. Both were left to gravity bag drainage. Electronically Signed   By: Aletta Edouard M.D.   On: 09/11/2017 09:34   Ir Nephrostomy Placement Right  Result Date: 09/11/2017 INDICATION: Advanced cervical carcinoma and failed bilateral ureteral stenting with development of severe bilateral hydronephrosis, progressive renal failure and anuria despite the presence of bilateral ureteral  stents. The patient has been referred for bilateral percutaneous nephrostomy tube placement. EXAM: IR NEPHROSTOMY PLACEMENT RIGHT; IR NEPHROSTOMY PLACEMENT LEFT COMPARISON:  Renal ultrasound on 09/08/2017 MEDICATIONS: 2 g IV Ancef; The antibiotic was administered in an appropriate time frame prior to skin puncture. ANESTHESIA/SEDATION: Fentanyl 62.5 mcg IV; Versed 1.5 mg IV Moderate Sedation Time:  23 minutes. The patient was continuously monitored during the procedure by the interventional radiology nurse under my direct supervision. CONTRAST:  37mL ISOVUE-300 IOPAMIDOL (ISOVUE-300) INJECTION 61% - administered into the collecting system(s) FLUOROSCOPY TIME:  Fluoroscopy Time: 1 minutes and 18 seconds. 8.0 mGy. COMPLICATIONS: None immediate. PROCEDURE: Informed written consent was obtained from the patient after a thorough discussion of the procedural risks, benefits and alternatives. All questions were addressed. Maximal Sterile Barrier Technique was utilized including caps, mask, sterile gowns, sterile gloves, sterile drape, hand hygiene and skin antiseptic. A timeout was performed prior to the initiation of the procedure. Ultrasound was used to localize both kidneys. Under ultrasound guidance, identical procedure was performed for both kidneys with initial puncture of the collecting systems with a 21 gauge needle. After return of urine, diluted contrast material was injected into the collecting systems. Guidewires were advanced. Transitional dilators were placed. The percutaneous tracts were then dilated over standard guidewires. Bilateral 10 French percutaneous nephrostomy tubes were placed over guidewires. Both were formed at the level of the renal pelvis bilaterally. Each catheter was injected with contrast material and fluoroscopic spot images obtained to confirm tube position. The catheters were connected to gravity drainage bags. Both catheters were secured at the skin with Prolene retention sutures and  StatLock devices. FINDINGS: Ultrasound shows the presence of severe hydronephrosis bilaterally, right greater than left. Bilateral 10 French nephrostomy tubes were placed and formed at the level of the renal pelvis bilaterally. There is good return of urine from both nephrostomy tubes. IMPRESSION: Bilateral percutaneous nephrostomy tube placement. Ten French nephrostomy tubes were placed bilaterally and formed at the level of the renal pelvis. Both were left to gravity bag drainage. Electronically Signed   By: Aletta Edouard M.D.   On: 09/11/2017 09:34      Management plans discussed with the patient, family and they are in agreement.  CODE STATUS:     Code Status Orders  (From admission, onward)        Start     Ordered   09/10/17 1713  Do not attempt resuscitation (DNR)  Continuous    Question Answer Comment  In the event of cardiac or respiratory ARREST Do not call a "code blue"   In the event of cardiac or respiratory ARREST Do not perform Intubation, CPR, defibrillation or ACLS   In the event of cardiac or respiratory ARREST Use medication by any route, position, wound  care, and other measures to relive pain and suffering. May use oxygen, suction and manual treatment of airway obstruction as needed for comfort.      09/10/17 1712  Advance Directive Documentation     Most Recent Value  Type of Advance Directive  Healthcare Power of Attorney, Living will  Pre-existing out of facility DNR order (yellow form or pink MOST form)  No data  "MOST" Form in Place?  No data      TOTAL TIME TAKING CARE OF THIS PATIENT: 40 minutes.    Henreitta Leber M.D on 09/12/2017 at 1:07 PM  Between 7am to 6pm - Pager - [phone]  After 6pm go to [email or web address] - Proofreader  [facility]  Office  [phone]  CC: Primary care physician; Adin Hector, MD

## 2017-09-12 NOTE — Progress Notes (Signed)
Asked pt's husband to call hospcie to let them know of EMS delay. He spoke with Mariann Laster and she stated that they will meet him at home whenever they arrive

## 2017-09-12 NOTE — Progress Notes (Signed)
RN spoke with Mariann Laster at hospice.  She encouraged pt's husband to go get Rx filled.  We are still waiting on EMS

## 2017-09-14 ENCOUNTER — Ambulatory Visit: Payer: Medicare Other

## 2017-09-15 ENCOUNTER — Ambulatory Visit: Payer: Medicare Other | Admitting: Urology

## 2017-09-15 ENCOUNTER — Encounter: Payer: Self-pay | Admitting: Urology

## 2017-09-15 ENCOUNTER — Ambulatory Visit: Payer: Medicare Other

## 2017-09-15 VITALS — BP 112/69 | HR 109 | Ht 65.0 in | Wt 129.0 lb

## 2017-09-15 DIAGNOSIS — C539 Malignant neoplasm of cervix uteri, unspecified: Secondary | ICD-10-CM

## 2017-09-15 DIAGNOSIS — N1330 Unspecified hydronephrosis: Secondary | ICD-10-CM | POA: Diagnosis not present

## 2017-09-15 MED ORDER — CIPROFLOXACIN HCL 500 MG PO TABS
500.0000 mg | ORAL_TABLET | Freq: Once | ORAL | Status: AC
  Administered 2017-09-15: 500 mg via ORAL

## 2017-09-15 MED ORDER — LIDOCAINE HCL 2 % EX GEL
1.0000 "application " | Freq: Once | CUTANEOUS | Status: AC
  Administered 2017-09-15: 1 via URETHRAL

## 2017-09-15 NOTE — Progress Notes (Signed)
   09/15/17  CC:  Chief Complaint  Patient presents with  . Cysto Stent Removal    HPI: 76 year old female with advanced metastatic cervical cancer with bilateral hydronephrosis managed with bilateral ureteral stents.  More recently, she was admitted with worsening renal failure noted to have recurrence of her hydronephrosis and stent failure.  She elected to pursue hospice but did have bilateral percutaneous nephrostomy tubes placed.  She returns to the office today for cystoscopy, stent removal.  Her tubes seem to be draining well.  Better than the right, right remains mildly bloody.  Blood pressure 112/69, pulse (!) 109, height 5\' 5"  (1.651 m), weight 129 lb (58.5 kg). NED. A&Ox3.   No respiratory distress   Abd soft, NT, ND Normal external genitalia with patent urethral meatus  Cystoscopy/ Stent removal procedure  Patient identification was confirmed, informed consent was obtained, and patient was prepped using Betadine solution.  Lidocaine jelly was administered per urethral meatus.    Preoperative abx where received prior to procedure.    Procedure: - Flexible cystoscope introduced, without any difficulty.   - Thorough search of the bladder revealed:    normal urethral meatus  Stent seen emanating from right ureteral orifice, grasped with stent graspers, and removed in entirety.    The scope was then replaced and the same procedure was used to   Post-Procedure: - Patient tolerated the procedure well  Assessment/ Plan:  1. Malignant neoplasm of cervix, unspecified site [facility]) Advanced metastatic cervical cancer status post bilateral percutaneous nephrostomy tubes after failed ureteral stents Now home on hospice - ciprofloxacin (CIPRO) tablet 500 mg - lidocaine (XYLOCAINE) 2 % jelly 1 application - IR NEPHROSTOMY EXCHANGE LEFT; Future - IR NEPHROSTOMY EXCHANGE RIGHT; Future  2. Hydronephrosis, unspecified hydronephrosis type Managed with bilateral nephrostomy  tubes Plan for tube exchange in 6 weeks if clinically well enough to tolerate Flush tubes as needed, would continue to flush the right tube daily until blood has cleared Dressing changes as needed  [name], MD

## 2017-09-16 ENCOUNTER — Ambulatory Visit: Payer: Medicare Other

## 2017-09-17 ENCOUNTER — Ambulatory Visit: Payer: Medicare Other

## 2017-09-17 NOTE — Progress Notes (Unsigned)
PSN spoke with husband last week.  He stated that his wife was currently hospitalized and wasn't sure what her needs would be.  Patient now home with Hospice care.

## 2017-09-18 ENCOUNTER — Ambulatory Visit: Payer: Medicare Other

## 2017-09-21 ENCOUNTER — Ambulatory Visit: Payer: Medicare Other

## 2017-09-23 ENCOUNTER — Ambulatory Visit: Payer: Medicare Other

## 2017-09-24 ENCOUNTER — Ambulatory Visit: Payer: Medicare Other

## 2017-09-25 ENCOUNTER — Ambulatory Visit: Payer: Medicare Other

## 2017-09-28 ENCOUNTER — Ambulatory Visit: Payer: Medicare Other

## 2017-09-30 ENCOUNTER — Ambulatory Visit: Payer: Medicare Other

## 2017-09-30 LAB — SURGICAL PATHOLOGY

## 2017-10-01 ENCOUNTER — Ambulatory Visit: Payer: Medicare Other

## 2017-10-02 ENCOUNTER — Ambulatory Visit: Payer: Medicare Other

## 2017-10-02 ENCOUNTER — Encounter: Payer: Self-pay | Admitting: Oncology

## 2017-10-03 ENCOUNTER — Ambulatory Visit: Payer: Medicare Other

## 2017-10-04 ENCOUNTER — Ambulatory Visit: Payer: Medicare Other

## 2017-10-05 ENCOUNTER — Ambulatory Visit: Payer: Medicare Other

## 2017-10-06 ENCOUNTER — Ambulatory Visit: Payer: Medicare Other

## 2017-10-07 ENCOUNTER — Ambulatory Visit: Payer: Medicare Other

## 2017-10-08 ENCOUNTER — Ambulatory Visit: Payer: Medicare Other

## 2017-10-09 ENCOUNTER — Ambulatory Visit: Payer: Medicare Other

## 2017-10-10 ENCOUNTER — Ambulatory Visit: Payer: Medicare Other

## 2017-10-12 ENCOUNTER — Ambulatory Visit: Payer: Medicare Other

## 2017-10-13 ENCOUNTER — Ambulatory Visit: Payer: Medicare Other

## 2017-10-14 ENCOUNTER — Telehealth: Payer: Self-pay | Admitting: Radiology

## 2017-10-14 ENCOUNTER — Ambulatory Visit: Payer: Medicare Other

## 2017-10-14 NOTE — Telephone Encounter (Signed)
Spoke with pt's husband re: nephrostomy tube exchange due week of 10/26/2017. Husband states pt is very weak & losing blood. Requests call back next week.

## 2017-10-14 NOTE — Telephone Encounter (Signed)
-----   Message from [name], MD sent at 09/15/2017 12:53 PM EST ----- This nice lady will need bilateral percutaneous nephrostomy tubes exchanged in 6 weeks if she is still living.  Unfortunately, her prognosis is poor and she is home on hospice.  You may want to wait for a few weeks to see if she is still living before scheduling.

## 2017-10-15 ENCOUNTER — Ambulatory Visit: Payer: Medicare Other

## 2017-10-16 ENCOUNTER — Ambulatory Visit: Payer: Medicare Other

## 2017-10-19 ENCOUNTER — Ambulatory Visit: Payer: Medicare Other

## 2017-10-20 ENCOUNTER — Ambulatory Visit: Payer: Medicare Other

## 2017-10-21 ENCOUNTER — Ambulatory Visit: Payer: Medicare Other

## 2017-10-21 NOTE — Telephone Encounter (Signed)
Husband states pt is very weak with decreased eating & drinking. Per hospice nurse, Devana, pt's nephrostomy tubes are doing well with frequent dressing changes & daily flushes. Per Dr Erlene Quan, [facility] to notify husband that due to this, she recommends checking on pt's condition in 2 weeks to determine if we should proceed with bilateral nephrostomy tube exchanges.

## 2017-10-29 ENCOUNTER — Encounter: Payer: Self-pay | Admitting: Oncology

## 2017-10-30 ENCOUNTER — Encounter: Payer: Self-pay | Admitting: Oncology

## 2017-11-04 NOTE — Telephone Encounter (Signed)
Spoke with husband. Pt is being admitted to hospice facility for caregiver respite for 5 days but pt is too weak to have procedure. Asks for return call next week.

## 2017-11-11 ENCOUNTER — Inpatient Hospital Stay: Payer: Medicare Other

## 2017-11-11 NOTE — Telephone Encounter (Signed)
Spoke with husband & pt's condition has declined significantly. Offered support & sympathy & suggested not trying to exchange the nephrostomy tubes. Husband in agreement & voices appreciation of call.

## 2017-11-17 NOTE — Telephone Encounter (Signed)
Spoke with husband & hospice nurse, Davana. Per nurse, left nephrostomy tube was difficult to flush this morning but unable to flush this afternoon. Encouraged husband to discuss risk vs benefit of exchanging tubes with hospice & his family. Nurse will discuss with pt & husband & call back with decision.

## 2017-11-17 NOTE — Telephone Encounter (Signed)
If nephrostomy tube was unable to be flushed, will need exchange.  Without exchange in a nondraining catheter, this will likely lead to progression of renal failure and expedite the dying process which may be desirable.  I would encourage him to discuss this whether or not to proceed with hospice and plan for exchange only if elected.  [name], MD

## 2017-11-17 NOTE — Telephone Encounter (Signed)
Received call from hospice nurse, Devana, stating pt's right nephrostomy tube flushes well & urine is draining to bag, however, yesterday the left tube didn't flush initially but husband states it did flush later.This morning there is no urine in the bag. Nurse is unsure if tube can be flushed today. Pt is very weak & activity is difficult. Nephrostomy tubes are due to be changed but due to pt's condition, husband elected to postpone exchange. Please advise.

## 2017-11-18 NOTE — Telephone Encounter (Signed)
Husband called requesting to have nephrostomy tubes exchanged. Will arrange.

## 2017-11-19 ENCOUNTER — Encounter: Payer: Self-pay | Admitting: Diagnostic Radiology

## 2017-11-19 ENCOUNTER — Ambulatory Visit
Admission: RE | Admit: 2017-11-19 | Discharge: 2017-11-19 | Disposition: A | Discharge status: Home / Self Care | Source: Ambulatory Visit | Attending: Urology | Admitting: Urology

## 2017-11-19 DIAGNOSIS — C539 Malignant neoplasm of cervix uteri, unspecified: Secondary | ICD-10-CM | POA: Diagnosis present

## 2017-11-19 DIAGNOSIS — N99528 Other complication of other external stoma of urinary tract: Secondary | ICD-10-CM | POA: Diagnosis not present

## 2017-11-19 HISTORY — PX: IR NEPHROSTOMY EXCHANGE RIGHT: IMG6070

## 2017-11-19 HISTORY — PX: IR NEPHROSTOMY EXCHANGE LEFT: IMG6069

## 2017-11-19 MED ORDER — LIDOCAINE HCL (PF) 1 % IJ SOLN
INTRAMUSCULAR | Status: AC
  Filled 2017-11-19: qty 30

## 2017-11-19 MED ORDER — IOPAMIDOL (ISOVUE-300) INJECTION 61%: 10.0000 mL | Freq: Once | INTRAVENOUS | Status: DC | PRN

## 2017-11-19 NOTE — Progress Notes (Signed)
Pt discharge via EMS non emergent transport. Left [facility] at 1515. [place] for d/c per Dr. Anselm Pancoast. Bilat Neph tube sites intact, dressing CDI

## 2017-11-19 NOTE — Procedures (Signed)
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  Bilateral nephrostomy tubes exchanged and new 10 Fr drains placed without complication.  No blood loss.  Cloudy clay colored fluid removed from left renal collecting system.

## 2017-12-10 ENCOUNTER — Telehealth: Payer: Self-pay | Admitting: Radiology

## 2017-12-10 NOTE — Telephone Encounter (Signed)
Pt's Hospice nurse states right nephrostomy tube will not flush & is leaking from the incision. She states there is a lot of sediment in the left nephrostomy bag but it flushes & drains well with approximately 200cc of output over the past 24 hours. Per Dr Erlene Quan, instructed nurse how to remove right nephrostomy tube & advised to apply an occlusive dressing to the site. Nurse voices understanding with no questions at this time.

## 2017-12-15 NOTE — Telephone Encounter (Signed)
Spoke with Hospice nurse who states nephrostomy tube was not removed d/t this is not within the scope of practice of an Therapist, sports. States urine leakage has decreased significantly as pt has taken a turn.

## 2017-12-28 DEATH — deceased

## 2018-01-07 ENCOUNTER — Encounter: Payer: Self-pay | Admitting: Oncology

## 2019-07-03 IMAGING — CT CT ABD-PELV W/O CM
2 of 5 series · 15 of 46 positions shown, 17 images · non-contrast
Comparison: None.

CLINICAL DATA: Evaluate hydronephrosis.

EXAM:
CT ABDOMEN AND PELVIS WITHOUT CONTRAST
TECHNIQUE: Multidetector CT imaging of the abdomen and pelvis was performed
following the standard protocol without IV contrast.

[Series 2: routine abd/pel wo · axial · 0.71mm/px · z∈[-449,-89]mm · 12 of 81 slices shown, 14 images]
[im 5/81  soft-tissue]
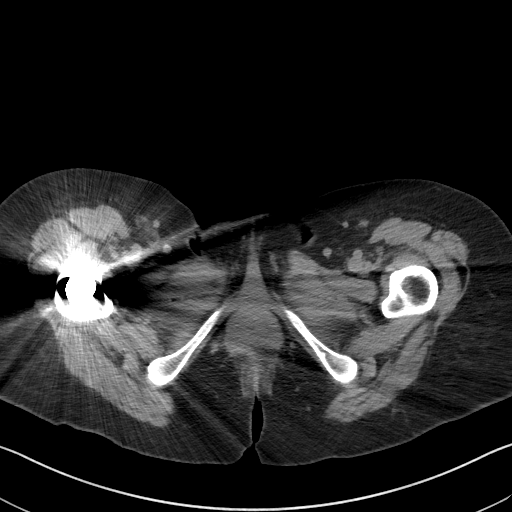
[im 5/81  bone]
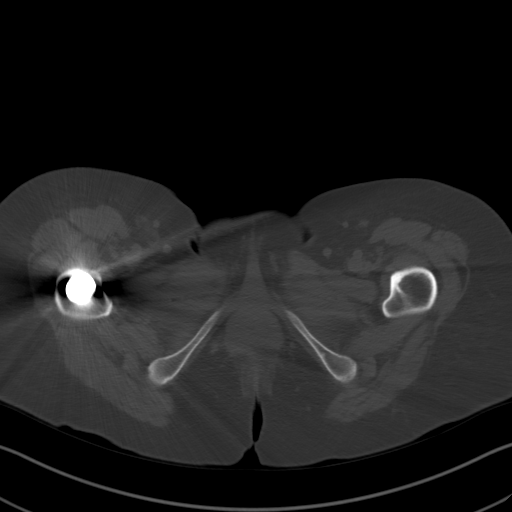
[im 17/81  soft-tissue]
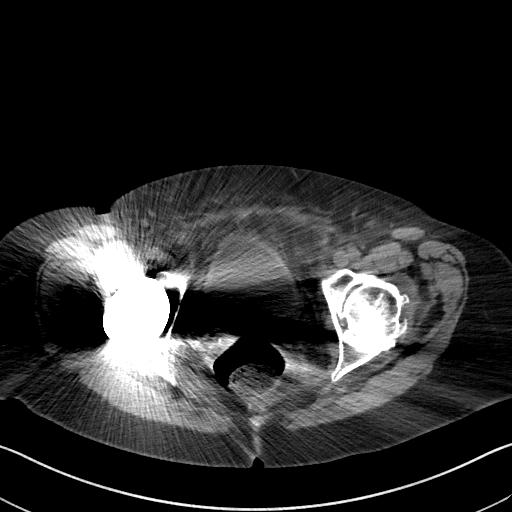
[im 21/81  soft-tissue]
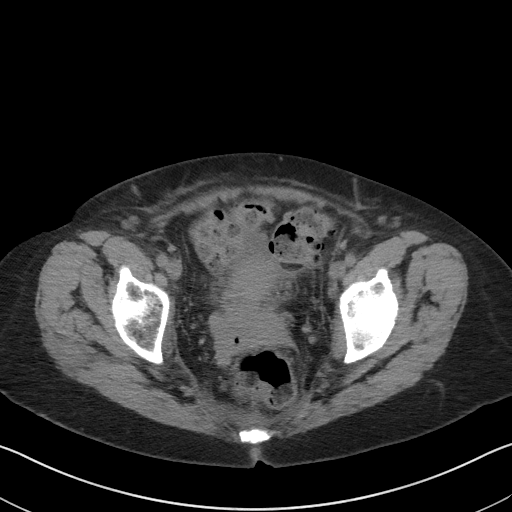
[im 29/81  soft-tissue]
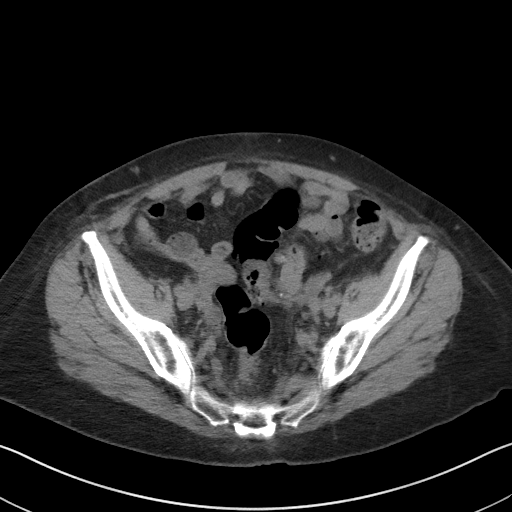
[im 33/81  soft-tissue]
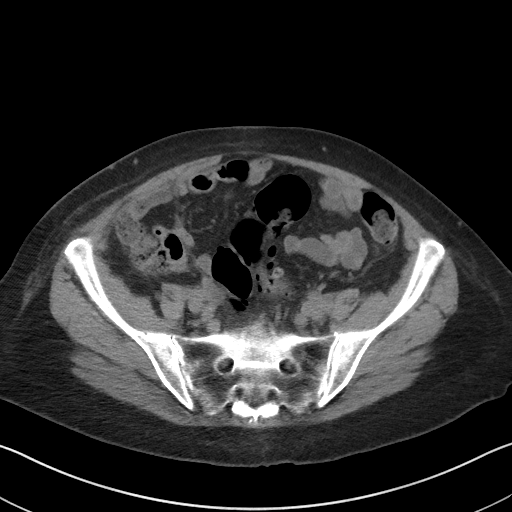
[im 41/81  soft-tissue]
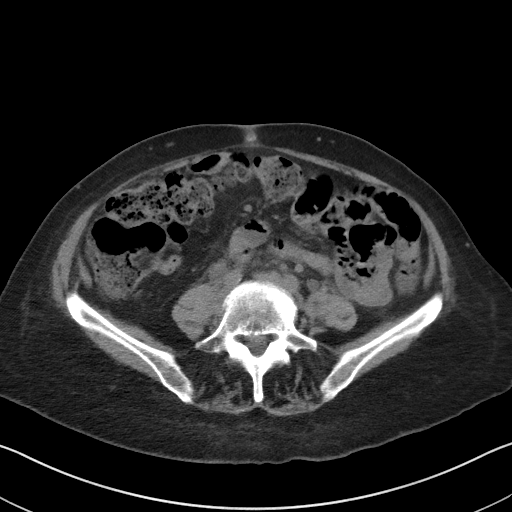
[im 45/81  soft-tissue]
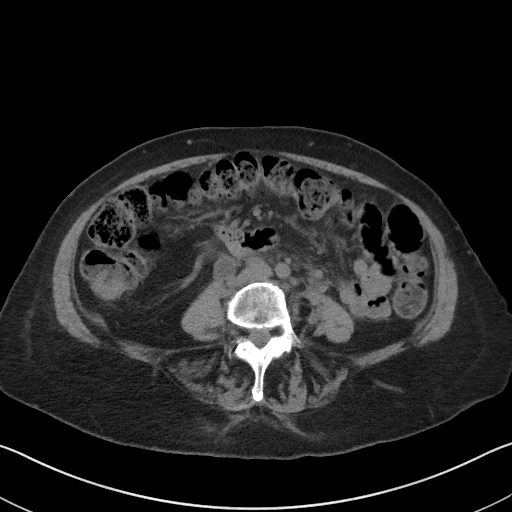
[im 53/81  soft-tissue]
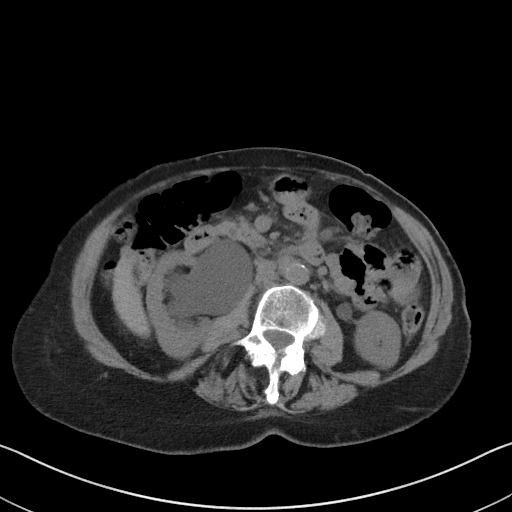
[im 57/81  soft-tissue]
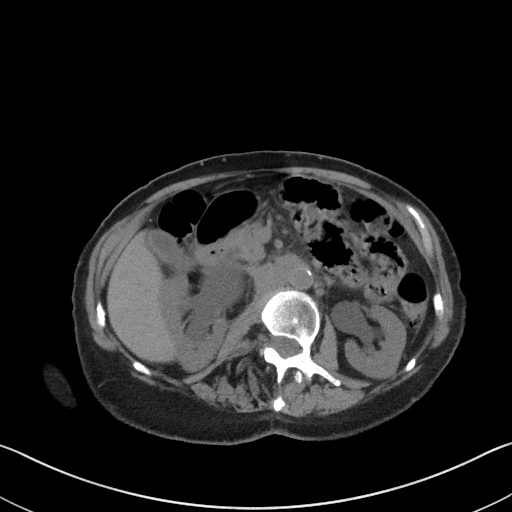
[im 57/81  bone]
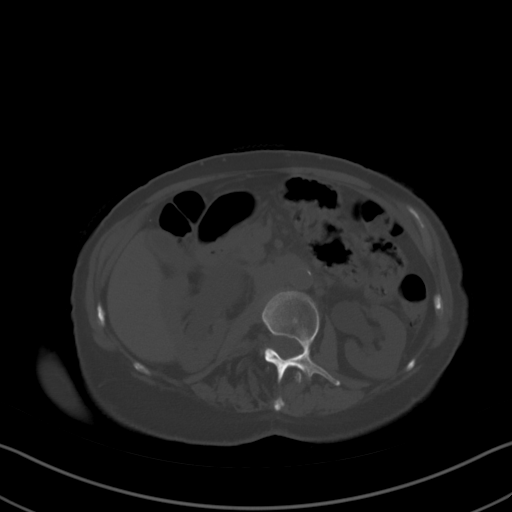
[im 65/81  soft-tissue]
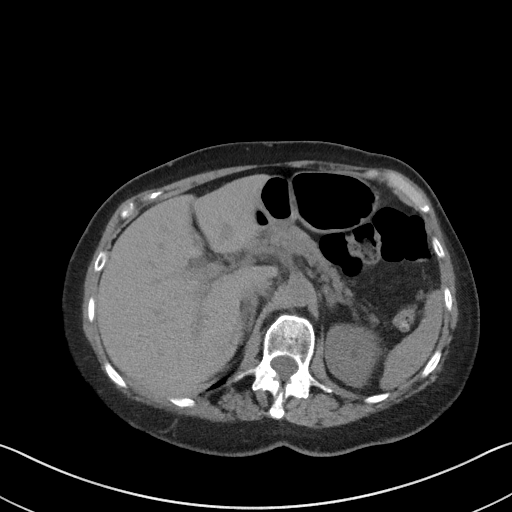
[im 69/81  soft-tissue]
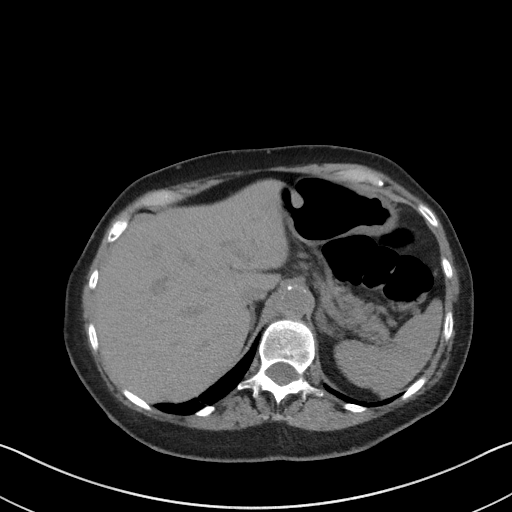
[im 77/81  soft-tissue]
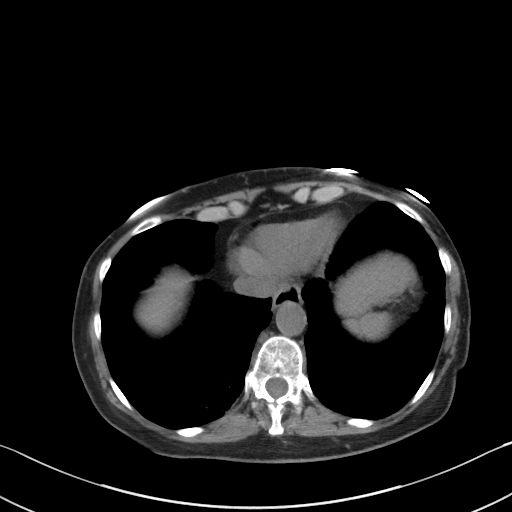

[Series 5: coronal st · coronal · 0.71mm/px · 3 of 82 slices shown]
[im 28/82  soft-tissue]
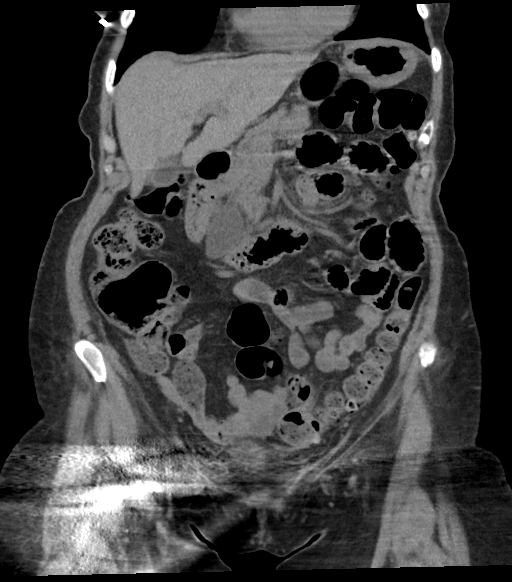
[im 37/82  soft-tissue]
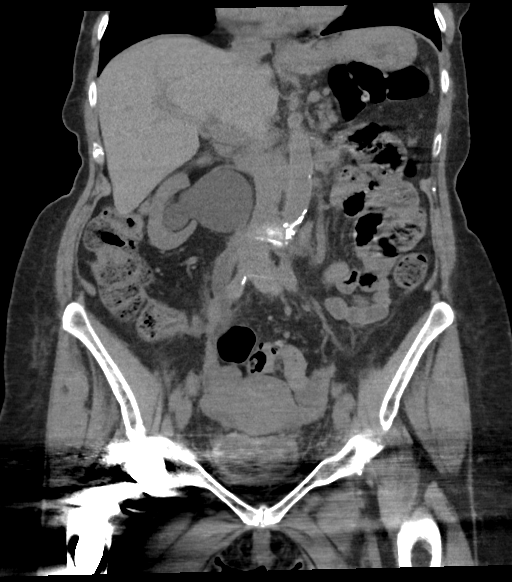
[im 46/82  soft-tissue]
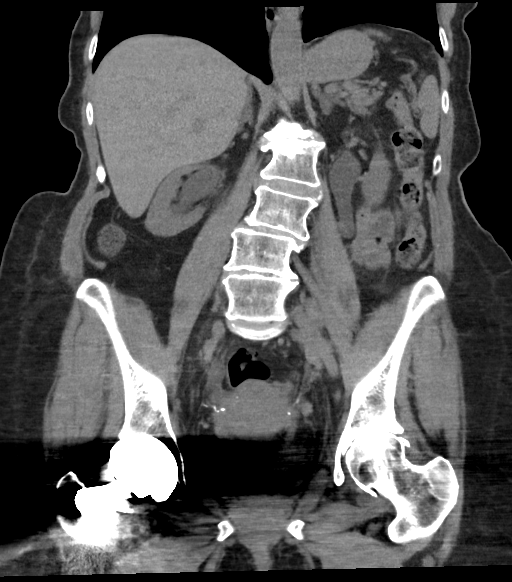

[15 of 46 positions shown; findings below may reference images not displayed]

FINDINGS: Lower chest: 2 nodules are identified in the right lower lobe. Index
nodule in the posterior right lower lobe measures 1 cm, image 7 of
series 3.

Hepatobiliary: No focal liver abnormality is seen. No gallstones,
gallbladder wall thickening, or biliary dilatation.

Pancreas: Unremarkable. No pancreatic ductal dilatation or
surrounding inflammatory changes.

Spleen: Normal in size without focal abnormality.

Adrenals/Urinary Tract: The adrenal glands are unremarkable.
Bilateral hydronephrosis and hydroureter identified right greater
than left. Dilated right ureter extends into the pelvis where it
abruptly terminates near the expanded vaginal dome/cervix, image
number 58 of series 2. Similarly the dilated left ureter terminates
in the region of the expanded left side of vaginal dome/cervix. This
area is poorly visualized due to lack of IV contrast material and
beam hardening artifact from right hip arthroplasty device. Urinary
bladder is grossly unremarkable.

Stomach/Bowel: The stomach appears normal. The small bowel loops
have a normal course and caliber. No pathologic dilatation of the
colon.

Vascular/Lymphatic: Aortic atherosclerosis. Retroperitoneal
adenopathy is identified. Index aortocaval node measures 2.1 cm,
image 26 of series 2. Index periaortic node measures 1 cm, image 26
of series 2. Left common iliac node measures 9 mm, image 34 of
series 2.

Reproductive: The vaginal dome and cervical area appears abnormally
expanded measuring 6.5 by 3.7 cm. Cannot rule out underlying mass.

Other: No ascites or focal fluid collections identified.

Musculoskeletal: Previous right hip arthroplasty device. Advanced
degenerative changes involve the left hip. Scoliosis and
degenerative disc disease noted.
IMPRESSION: 1. Bilateral hydronephrosis identified right greater than left.
Dilated ureters extend to the level of the pelvis. No obstructing
stone identified. Suspicious for distal ureteral obstruction
secondary to pelvic mass.
2. Enlargement of the vaginal dome and cervical area. Suspicious for
underlying malignancy. Correlation with physical exam findings,
pelvic ultrasound and/or contrast enhanced CT of the pelvis is
advised.
3. Enlarged retroperitoneal lymph nodes. Suspicious for metastatic
adenopathy.
4. There are 2 nonspecific pulmonary nodules identified within the
right lower lobe which measure up to 1 cm. Non-contrast chest CT at
3-6 months is recommended. If the nodules are stable at time of
repeat CT, then future CT at 18-24 months (from today's scan) is
considered optional for low-risk patients, but is recommended for
high-risk patients. This recommendation follows the consensus
statement: Guidelines for Management of Incidental Pulmonary Nodules
Detected on CT Images: From the [HOSPITAL] 9516; Radiology
